# Patient Record
Sex: Female | Born: 1970 | Race: White | Hispanic: No | Marital: Married | Smoking: Never smoker
Health system: Southern US, Community
[De-identification: ages and names within clinical notes are randomized; demographics above are authoritative.]

## PROBLEM LIST (undated history)

## (undated) DIAGNOSIS — Z8719 Personal history of other diseases of the digestive system: Secondary | ICD-10-CM

## (undated) DIAGNOSIS — F419 Anxiety disorder, unspecified: Secondary | ICD-10-CM

## (undated) DIAGNOSIS — E785 Hyperlipidemia, unspecified: Secondary | ICD-10-CM

## (undated) DIAGNOSIS — M1990 Unspecified osteoarthritis, unspecified site: Secondary | ICD-10-CM

## (undated) DIAGNOSIS — F32A Depression, unspecified: Secondary | ICD-10-CM

## (undated) DIAGNOSIS — R112 Nausea with vomiting, unspecified: Secondary | ICD-10-CM

## (undated) DIAGNOSIS — J189 Pneumonia, unspecified organism: Secondary | ICD-10-CM

## (undated) DIAGNOSIS — E669 Obesity, unspecified: Secondary | ICD-10-CM

## (undated) DIAGNOSIS — F329 Major depressive disorder, single episode, unspecified: Secondary | ICD-10-CM

## (undated) DIAGNOSIS — Z9889 Other specified postprocedural states: Secondary | ICD-10-CM

## (undated) DIAGNOSIS — T8859XA Other complications of anesthesia, initial encounter: Secondary | ICD-10-CM

## (undated) HISTORY — DX: Hyperlipidemia, unspecified: E78.5

## (undated) HISTORY — PX: LAPAROSCOPIC GASTRIC BANDING: SHX1100

## (undated) HISTORY — DX: Obesity, unspecified: E66.9

## (undated) HISTORY — PX: TONSILLECTOMY: SHX5217

## (undated) HISTORY — PX: CHOLECYSTECTOMY: SHX55

## (undated) HISTORY — DX: Depression, unspecified: F32.A

## (undated) HISTORY — PX: BREAST REDUCTION SURGERY: SHX8

## (undated) HISTORY — PX: ROTATOR CUFF REPAIR: SHX139

---
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
## 2000-02-24 ENCOUNTER — Ambulatory Visit (HOSPITAL_BASED_OUTPATIENT_CLINIC_OR_DEPARTMENT_OTHER): Admission: RE | Admit: 2000-02-24 | Discharge: 2000-02-25 | Payer: Self-pay | Admitting: Otolaryngology

## 2000-02-24 ENCOUNTER — Encounter (INDEPENDENT_AMBULATORY_CARE_PROVIDER_SITE_OTHER): Payer: Self-pay | Admitting: Specialist

## 2007-10-08 ENCOUNTER — Encounter: Admission: RE | Admit: 2007-10-08 | Discharge: 2007-10-08 | Payer: Self-pay | Admitting: *Deleted

## 2007-10-08 ENCOUNTER — Ambulatory Visit (HOSPITAL_COMMUNITY): Admission: RE | Admit: 2007-10-08 | Discharge: 2007-10-08 | Payer: Self-pay | Admitting: *Deleted

## 2007-10-10 ENCOUNTER — Ambulatory Visit (HOSPITAL_COMMUNITY): Admission: RE | Admit: 2007-10-10 | Discharge: 2007-10-10 | Payer: Self-pay | Admitting: *Deleted

## 2008-02-05 ENCOUNTER — Encounter: Admission: RE | Admit: 2008-02-05 | Discharge: 2008-03-09 | Payer: Self-pay | Admitting: *Deleted

## 2008-02-25 ENCOUNTER — Ambulatory Visit (HOSPITAL_COMMUNITY): Admission: RE | Admit: 2008-02-25 | Discharge: 2008-02-26 | Payer: Self-pay | Admitting: *Deleted

## 2008-02-26 ENCOUNTER — Ambulatory Visit: Payer: Self-pay | Admitting: Vascular Surgery

## 2008-02-26 ENCOUNTER — Encounter (INDEPENDENT_AMBULATORY_CARE_PROVIDER_SITE_OTHER): Payer: Self-pay | Admitting: *Deleted

## 2008-03-12 ENCOUNTER — Encounter: Admission: RE | Admit: 2008-03-12 | Discharge: 2008-06-10 | Payer: Self-pay | Admitting: *Deleted

## 2010-08-23 NOTE — Op Note (Signed)
NAME:  Heidi Olson, Heidi Olson     ACCOUNT NO.:  [id number]   MEDICAL RECORD NO.:  [id number]          PATIENT TYPE:  OIB   LOCATION:  1525                         FACILITY:  [facility]   PHYSICIAN:  Alfonse Ras, MD   DATE OF BIRTH:  [birth year]   DATE OF PROCEDURE:  02/25/2008  DATE OF DISCHARGE:                               OPERATIVE REPORT   PREOPERATIVE DIAGNOSIS:  Medically refractory morbid obesity, BMI of 46.   POSTOPERATIVE DIAGNOSIS:  Medically refractory morbid obesity, BMI of  46.   PROCEDURE:  Laparoscopic adjustable gastric banding with the APS system.   SURGEON:  Alfonse Ras, M.D.   ASSISTANT:  Sandria Bales. Ezzard Standing, M.D.   ANESTHESIA:  General.   DESCRIPTION:  The patient was taken to the operating room after informed  consent was granted.  After adequate general anesthesia was induced  using endotracheal tube, the abdomen was prepped and draped in normal  sterile fashion after time-out was accomplished with the OR staff using  a 11 mm OptiVu trocar in the left upper quadrant under direct vision,  pneumoperitoneum was obtained.  A 15-mm trocar was placed in the right  upper quadrant, a 11-mm trocar was placed just below that right upper  quadrant, a 11-mm trocar was placed in left paramedian position.  The  patient was placed in the steep head-up position.  The infant liver  retractor was placed and the left lateral segment of the liver was  retracted anteriorly.  Angle of Hiss was sharply and bluntly dissected.  The pars flaccida was then opened with sharp dissection.  The crossing  fat on the right crus of the diaphragm was identified and incised.  The  band passer was then placed in a retrogastric position and brought out  at the angle of Hiss.  An APS band was then placed around the stomach.  A sizing balloon was placed down through the esophagus and then inflated  to 15 mL of air and pulled back.  There was significant resistance and  no evidence of hiatal  hernia.  Balloon was deflated and the band was  snapped around the sizing tube.  It moved easily.  The sizing tube was  removed.  Anterior plication was accomplished with interrupted 2-0  Ethibond sutures.  An Anti-Slip stitch was also placed.  The band was in  good position and the tubing was brought out through the lower of the  right upper quadrant incision.  I tried to place a subcutaneous port.  However, it did not lay nicely and so I opted to fix it in a traditional  fashion with 2-0 Prolene down to the anterior abdominal fascia.  The  subcutaneous tissue was closed with running 2-0 Vicryl.  Skin incisions  were closed with subcuticular 4-0 Monocryl.  Steri-Strips and sterile  dressings were applied.  The patient tolerated the procedure well, went  to PACU in good condition.      Alfonse Ras, MD  Electronically Signed     KRE/MEDQ  D:  02/25/2008  T:  02/26/2008  Job:  [id number]

## 2011-01-10 LAB — HEMOGLOBIN AND HEMATOCRIT, BLOOD
HCT: 41
Hemoglobin: 13.9

## 2011-01-10 LAB — DIFFERENTIAL
Basophils Absolute: 0
Basophils Relative: 0
Eosinophils Absolute: 0
Eosinophils Relative: 0
Lymphocytes Relative: 27
Lymphocytes Relative: 6 — ABNORMAL LOW
Lymphs Abs: 0.6 — ABNORMAL LOW
Lymphs Abs: 1.5
Monocytes Absolute: 0.5
Monocytes Relative: 5
Monocytes Relative: 8
Neutro Abs: 7.6
Neutrophils Relative %: 88 — ABNORMAL HIGH

## 2011-01-10 LAB — CBC
HCT: 40.3
MCHC: 33.9
MCV: 93
Platelets: 272
RBC: 4.33
RDW: 12.8
WBC: 8.7

## 2011-01-10 LAB — COMPREHENSIVE METABOLIC PANEL
Alkaline Phosphatase: 61
Calcium: 9.4
GFR calc Af Amer: >60
GFR calc non Af Amer: >60
Sodium: 141
Total Bilirubin: 0.8

## 2011-01-10 LAB — PREGNANCY, URINE: Preg Test, Ur: NEGATIVE

## 2011-02-08 ENCOUNTER — Encounter (INDEPENDENT_AMBULATORY_CARE_PROVIDER_SITE_OTHER): Payer: Self-pay | Admitting: General Surgery

## 2011-04-26 ENCOUNTER — Encounter (INDEPENDENT_AMBULATORY_CARE_PROVIDER_SITE_OTHER): Payer: Self-pay | Admitting: General Surgery

## 2011-04-26 ENCOUNTER — Ambulatory Visit (INDEPENDENT_AMBULATORY_CARE_PROVIDER_SITE_OTHER): Payer: BC Managed Care – PPO | Admitting: General Surgery

## 2011-04-26 DIAGNOSIS — Z9884 Bariatric surgery status: Secondary | ICD-10-CM | POA: Insufficient documentation

## 2011-04-26 NOTE — Progress Notes (Signed)
History: Patient returns for followup of LAP-BAND placed November of 2009. She was last seen here almost 2 years ago. She has done extremely well with her weight loss having lost a total of 135 pounds from preop of 291 to current 156. PMI is down from preop of 46.2 to current 24.8. She feels very appropriate restriction with small amounts of food but is not having any significant problems with vomiting or regurgitation or nighttime reflux. She has had a breast reduction and is anticipating a tummy tuck by Dr. Stephens November. She has noted some tenderness and prominence at her port it was concerned there could be a problem that would interfere with her abdominoplasty. It has not been read or swollen.  Exam:  General: Healthy-appearing female Skin: Warm and dry no rash infection Abdomen: Soft and nontender. I do not feel any hernia, fluid collection, or other abnormality around the port.  Assessment and plan: Doing extremely well following lap band was remarkable weight loss and no complications identified. I did not see a problem with the port. She had no preoperative comorbidities. I told her if Dr. Stephens November needed to move the port to a different location at the time of her abdominoplasty this would not be a problem. It of course needs to remain in place. She will return in one year.

## 2013-07-29 ENCOUNTER — Telehealth (HOSPITAL_COMMUNITY): Payer: Self-pay

## 2013-07-29 NOTE — Telephone Encounter (Signed)
This patient is overdue for recommended follow-up with a bariatric surgeon at [facility]. Call attempted today to reestablish post-op care with [facility], but unable to reach patient by phone.  A letter will be mailed to the patient today to the address on file from [place] advising the patient on the benefits of follow-up care and directing them to call [facility] at [phone] to schedule an appointment at their earliest convenience.  ° °Amanda T. Fleming °Bariatric Office Coordinator °[phone] ° °

## 2013-10-27 ENCOUNTER — Ambulatory Visit (INDEPENDENT_AMBULATORY_CARE_PROVIDER_SITE_OTHER): Payer: BC Managed Care – PPO | Admitting: General Surgery

## 2013-10-27 ENCOUNTER — Encounter (INDEPENDENT_AMBULATORY_CARE_PROVIDER_SITE_OTHER): Payer: Self-pay | Admitting: General Surgery

## 2013-10-27 VITALS — BP 110/64 | HR 72 | Temp 98.6°F | Resp 14 | Ht 67.0 in | Wt 167.2 lb

## 2013-10-27 DIAGNOSIS — Z4651 Encounter for fitting and adjustment of gastric lap band: Secondary | ICD-10-CM

## 2013-10-28 ENCOUNTER — Ambulatory Visit
Admission: RE | Admit: 2013-10-28 | Discharge: 2013-10-28 | Disposition: A | Discharge status: Home / Self Care | Payer: BC Managed Care – PPO | Source: Ambulatory Visit | Attending: General Surgery | Admitting: General Surgery

## 2013-10-28 ENCOUNTER — Telehealth (INDEPENDENT_AMBULATORY_CARE_PROVIDER_SITE_OTHER): Payer: Self-pay

## 2013-10-28 DIAGNOSIS — Z4651 Encounter for fitting and adjustment of gastric lap band: Secondary | ICD-10-CM

## 2013-10-28 NOTE — Progress Notes (Signed)
Chief complaint: Progressive by mouth intolerance, status post lap band  History: Patient returns to the office with a history of lap band placement November 2009 by Dr. Zettie Pho.  She was last seen January 2013 with excellent weight loss and no adjustment made. She was anticipating abdominoplasty. She has not returned since then. She did not have the abdominoplasty done. She states that she began having dysphagia about a month ago. This has worsened progressively. She has had a feeling of a lump or pain in her epigastrium and progressive intolerance to initially solids and for about the last week even liquids. She's been having a lot of reflux.  She has had wheezing and cough which has not responded to medical management.  Exam: BP 110/64  Pulse 72  Temp(Src) 98.6 F (37 C) (Oral)  Resp 14  Ht 5\' 7"  (1.702 m)  Wt 167 lb 3.2 oz (75.841 kg)  BMI 26.18 kg/m2 Total weight loss 124 pounds, upper lip and pounds from January 2013 General: Well-developed Caucasian female no acute distress Abdomen: Soft and nontender. Port site looks fine.  Assessment and plan: I suspect all of her symptoms including respiratory issues are secondary to over restriction of her lap band with reflux. With the degree of difficulty and the pain she is having on concerned about a possible slipped. I recommended we remove all the fluid from her band as an initial step. I accessed her port without difficulty and found 5.5 cc and removed all of this. She felt immediately that she was able to swallow water without difficulty which is a marked improvement. We will schedule her for barium swallow in the next day or 2 and I will call her with the results. We give her a default appointment to return in 4 weeks.

## 2013-10-29 ENCOUNTER — Telehealth (INDEPENDENT_AMBULATORY_CARE_PROVIDER_SITE_OTHER): Payer: Self-pay | Admitting: General Surgery

## 2013-10-29 NOTE — Telephone Encounter (Signed)
Call the patient with report of barium swallow showing no slip

## 2013-11-19 ENCOUNTER — Encounter (INDEPENDENT_AMBULATORY_CARE_PROVIDER_SITE_OTHER): Payer: Self-pay | Admitting: General Surgery

## 2013-11-19 ENCOUNTER — Ambulatory Visit (INDEPENDENT_AMBULATORY_CARE_PROVIDER_SITE_OTHER): Payer: BC Managed Care – PPO | Admitting: General Surgery

## 2013-11-19 VITALS — BP 122/76 | HR 75 | Ht 66.0 in | Wt 176.0 lb

## 2013-11-19 DIAGNOSIS — Z4651 Encounter for fitting and adjustment of gastric lap band: Secondary | ICD-10-CM

## 2013-11-19 NOTE — Progress Notes (Signed)
Chief complaint: Followup LAP-BAND  History: Patient returned to the office after all fluid was removed from her LAP-BAND for restriction 3 weeks ago. Her band was placed a number of years ago with excellent weight loss. Her symptoms were completely relieved after removal of the fluid.  Barium swallow was obtained showing no slip or complication. She is now eating greater amounts. She feels she should and has put on some weight and is interested in refilling.  Exam: BP 122/76  Pulse 75  Ht 5\' 6"  (1.676 m)  Wt 176 lb (79.833 kg)  BMI 28.42 kg/m2 General: Appears well Abdomen: Soft nontender nondistended  Assessment and plan: Episode of over restriction. No complication identified. We elect to go ahead with a fill today and I added 3 cc without difficulty. Return in approximately one month.

## 2013-12-31 ENCOUNTER — Encounter (INDEPENDENT_AMBULATORY_CARE_PROVIDER_SITE_OTHER): Payer: BC Managed Care – PPO | Admitting: General Surgery

## 2016-09-28 ENCOUNTER — Encounter (HOSPITAL_COMMUNITY): Payer: Self-pay

## 2019-03-12 ENCOUNTER — Encounter: Payer: Self-pay | Admitting: Gastroenterology

## 2019-04-14 ENCOUNTER — Other Ambulatory Visit: Payer: Self-pay

## 2019-04-14 ENCOUNTER — Ambulatory Visit: Payer: Self-pay | Admitting: *Deleted

## 2019-04-14 VITALS — Temp 96.6°F | Ht 67.25 in | Wt 195.0 lb

## 2019-04-14 DIAGNOSIS — Z1159 Encounter for screening for other viral diseases: Secondary | ICD-10-CM

## 2019-04-14 DIAGNOSIS — Z1211 Encounter for screening for malignant neoplasm of colon: Secondary | ICD-10-CM

## 2019-04-14 MED ORDER — SUPREP BOWEL PREP KIT 17.5-3.13-1.6 GM/177ML PO SOLN: 1.0000 | Freq: Once | ORAL | 0 refills | Status: AC

## 2019-04-14 NOTE — Progress Notes (Signed)
No egg or soy allergy known to patient  No issues with past sedation with any surgeries  or procedures, no intubation problems - with general anesthesia some N/V No diet pills per patient No home 02 use per patient  No blood thinners per patient  Pt denies issues with constipation  No A fib or A flutter  EMMI video sent to pt's e mail   suprep $15   Due to the COVID-19 pandemic we are asking patients to follow these guidelines. Please only bring one care partner. Please be aware that your care partner may wait in the car in the parking lot or if they feel like they will be too hot to wait in the car, they may wait in the lobby on the 4th floor. All care partners are required to wear a mask the entire time (we do not have any that we can provide them), they need to practice social distancing, and we will do a Covid check for all patient's and care partners when you arrive. Also we will check their temperature and your temperature. If the care partner waits in their car they need to stay in the parking lot the entire time and we will call them on their cell phone when the patient is ready for discharge so they can bring the car to the front of the building. Also all patient's will need to wear a mask into building.

## 2019-04-15 ENCOUNTER — Encounter: Payer: Self-pay | Admitting: Gastroenterology

## 2019-04-23 ENCOUNTER — Ambulatory Visit (INDEPENDENT_AMBULATORY_CARE_PROVIDER_SITE_OTHER): Payer: BC Managed Care – PPO

## 2019-04-23 ENCOUNTER — Telehealth: Payer: Self-pay | Admitting: Gastroenterology

## 2019-04-23 ENCOUNTER — Other Ambulatory Visit: Payer: Self-pay | Admitting: Gastroenterology

## 2019-04-23 DIAGNOSIS — Z1159 Encounter for screening for other viral diseases: Secondary | ICD-10-CM

## 2019-04-23 DIAGNOSIS — Z1211 Encounter for screening for malignant neoplasm of colon: Secondary | ICD-10-CM

## 2019-04-23 MED ORDER — CLENPIQ 10-3.5-12 MG-GM -GM/160ML PO SOLN: 1.0000 | ORAL | 0 refills | Status: DC

## 2019-04-23 NOTE — Telephone Encounter (Signed)
New RX sent to patient's pharmacy for Clenpiq. New Clenpiq prep instructions sent to pt via Email. Pt is aware.

## 2019-04-24 LAB — SARS CORONAVIRUS 2 (TAT 6-24 HRS): SARS Coronavirus 2: NEGATIVE

## 2019-04-28 ENCOUNTER — Ambulatory Visit (AMBULATORY_SURGERY_CENTER): Payer: BC Managed Care – PPO | Admitting: Gastroenterology

## 2019-04-28 ENCOUNTER — Encounter: Payer: Self-pay | Admitting: Gastroenterology

## 2019-04-28 ENCOUNTER — Other Ambulatory Visit: Payer: Self-pay

## 2019-04-28 VITALS — BP 107/73 | HR 70 | Temp 98.0°F | Resp 10 | Ht 67.25 in | Wt 195.0 lb

## 2019-04-28 DIAGNOSIS — Z1211 Encounter for screening for malignant neoplasm of colon: Secondary | ICD-10-CM

## 2019-04-28 DIAGNOSIS — D127 Benign neoplasm of rectosigmoid junction: Secondary | ICD-10-CM

## 2019-04-28 MED ORDER — SODIUM CHLORIDE 0.9 % IV SOLN: 500.0000 mL | Freq: Once | INTRAVENOUS | Status: DC

## 2019-04-28 NOTE — Progress Notes (Signed)
Called to room to assist during endoscopic procedure.  Patient ID and intended procedure confirmed with present staff. Received instructions for my participation in the procedure from the performing physician.  

## 2019-04-28 NOTE — Progress Notes (Addendum)
Per Dr. Lyndel Safe he went over the findings with pt's fiance. Maw  No problems noted in the recovery room. maw

## 2019-04-28 NOTE — Progress Notes (Signed)
Report to PACU, RN, vss, BBS= Clear.  

## 2019-04-28 NOTE — Op Note (Signed)
[place] Patient Name: Heidi Olson [facility] Procedure Date: 04/28/2019 9:35 AM MRN: [id number] Endoscopist: Jackquline Denmark , MD Age: 49 Referring MD:  Date of Birth: 1970/05/18 Gender: Female Account #: [id number] Procedure:                Colonoscopy Indications:              Screening for colorectal malignant neoplasm Medicines:                Monitored Anesthesia Care Procedure:                Pre-Anesthesia Assessment:                           - Prior to the procedure, a History and Physical                            was performed, and patient medications and                            allergies were reviewed. The patient's tolerance of                            previous anesthesia was also reviewed. The risks                            and benefits of the procedure and the sedation                            options and risks were discussed with the patient.                            All questions were answered, and informed consent                            was obtained. Prior Anticoagulants: The patient has                            taken no previous anticoagulant or antiplatelet                            agents. ASA Grade Assessment: II - A patient with                            mild systemic disease. After reviewing the risks                            and benefits, the patient was deemed in                            satisfactory condition to undergo the procedure.                           After obtaining informed consent, the colonoscope  was passed under direct vision. Throughout the                            procedure, the patient's blood pressure, pulse, and                            oxygen saturations were monitored continuously. The                            Colonoscope was introduced through the anus and                            advanced to the 1 cm into the ileum. The                            colonoscopy was  performed without difficulty. The                            patient tolerated the procedure well. The quality                            of the bowel preparation was good. The terminal                            ileum, ileocecal valve, appendiceal orifice, and                            rectum were photographed. Scope In: 9:40:19 AM Scope Out: 9:58:06 AM Scope Withdrawal Time: 0 hours 13 minutes 33 seconds  Total Procedure Duration: 0 hours 17 minutes 47 seconds  Findings:                 A 4 mm polyp was found in the recto-sigmoid colon.                            The polyp was sessile. The polyp was removed with a                            cold snare. Resection and retrieval were complete.                           A single small-mouthed diverticulum was found in                            the sigmoid colon.                           Non-bleeding internal hemorrhoids were found during                            retroflexion. The hemorrhoids were small.                           The terminal ileum appeared normal.  The exam was otherwise without abnormality on                            direct and retroflexion views. Complications:            No immediate complications. Estimated Blood Loss:     Estimated blood loss: none. Impression:               - One 4 mm polyp at the recto-sigmoid colon,                            removed with a cold snare. Resected and retrieved.                           - Minimal sigmoid diverticulosis.                           - Otherwise normal colonoscopy to TI. Recommendation:           - Patient has a contact number available for                            emergencies. The signs and symptoms of potential                            delayed complications were discussed with the                            patient. Return to normal activities tomorrow.                            Written discharge instructions were provided to the                             patient.                           - Resume previous diet.                           - Continue present medications.                           - Await pathology results.                           - Repeat colonoscopy for surveillance based on                            pathology results.                           - Return to GI clinic PRN.                           - D/W Lennette Bihari. Jackquline Denmark, MD 04/28/2019 10:03:03 AM This report has been signed electronically.

## 2019-04-28 NOTE — Patient Instructions (Signed)
YOU HAD AN ENDOSCOPIC PROCEDURE TODAY AT [facility]:   Refer to the procedure report that was given to you for any specific questions about what was found during the examination.  If the procedure report does not answer your questions, please call your gastroenterologist to clarify.  If you requested that your care partner not be given the details of your procedure findings, then the procedure report has been included in a sealed envelope for you to review at your convenience later.  YOU SHOULD EXPECT: Some feelings of bloating in the abdomen. Passage of more gas than usual.  Walking can help get rid of the air that was put into your GI tract during the procedure and reduce the bloating. If you had a lower endoscopy (such as a colonoscopy or flexible sigmoidoscopy) you may notice spotting of blood in your stool or on the toilet paper. If you underwent a bowel prep for your procedure, you may not have a normal bowel movement for a few days.  Please Note:  You might notice some irritation and congestion in your nose or some drainage.  This is from the oxygen used during your procedure.  There is no need for concern and it should clear up in a day or so.  SYMPTOMS TO REPORT IMMEDIATELY:   Following lower endoscopy (colonoscopy or flexible sigmoidoscopy):  Excessive amounts of blood in the stool  Significant tenderness or worsening of abdominal pains  Swelling of the abdomen that is new, acute  Fever of 100F or higher   For urgent or emergent issues, a gastroenterologist can be reached at any hour by calling [phone].   DIET:  We do recommend a small meal at first, but then you may proceed to your regular diet.  Drink plenty of fluids but you should avoid alcoholic beverages for 24 hours.  ACTIVITY:  You should plan to take it easy for the rest of today and you should NOT DRIVE or use heavy machinery until tomorrow (because of the sedation medicines used during the test).     FOLLOW UP: Our staff will call the number listed on your records 48-72 hours following your procedure to check on you and address any questions or concerns that you may have regarding the information given to you following your procedure. If we do not reach you, we will leave a message.  We will attempt to reach you two times.  During this call, we will ask if you have developed any symptoms of COVID 19. If you develop any symptoms (ie: fever, flu-like symptoms, shortness of breath, cough etc.) before then, please call [phone].  If you test positive for Covid 19 in the 2 weeks post procedure, please call and report this information to [place].    If any biopsies were taken you will be contacted by phone or by letter within the next 1-3 weeks.  Please call us at [phone] if you have not heard about the biopsies in 3 weeks.    SIGNATURES/CONFIDENTIALITY: You and/or your care partner have signed paperwork which will be entered into your electronic medical record.  These signatures attest to the fact that that the information above on your After Visit Summary has been reviewed and is understood.  Full responsibility of the confidentiality of this discharge information lies with you and/or your care-partner.    Handouts were given to your  on polyps, diverticulosis, hemorrhoids,and a high fiber diet with liberal fluid intake. You may resume your current medications  today. Await biopsy results. Please call if any questions or concerns.

## 2019-04-28 NOTE — Progress Notes (Signed)
Pt. Reports no change in her medical or surgical history since her pre-visit 04/14/2019.

## 2019-04-30 ENCOUNTER — Telehealth: Payer: Self-pay

## 2019-04-30 NOTE — Telephone Encounter (Signed)
  Follow up Call-  Call back number 04/28/2019  Post procedure Call Back phone  # [phone]  Permission to leave phone message Yes  Some recent data might be hidden     Patient questions:  Do you have a fever, pain , or abdominal swelling? No. Pain Score  0 *  Have you tolerated food without any problems? Yes.    Have you been able to return to your normal activities? Yes.    Do you have any questions about your discharge instructions: Diet   No. Medications  No. Follow up visit  No.  Do you have questions or concerns about your Care? No.  Actions: * If pain score is 4 or above: No action needed, pain <4.  1. Have you developed a fever since your procedure? No  2.   Have you had an respiratory symptoms (SOB or cough) since your procedure? No  3.   Have you tested positive for COVID 19 since your procedure No  4.   Have you had any family members/close contacts diagnosed with the COVID 19 since your procedure?  No   If yes to any of these questions please route to Joylene John, RN and Alphonsa Gin, RN.

## 2019-05-01 ENCOUNTER — Encounter: Payer: Self-pay | Admitting: Gastroenterology

## 2020-03-03 ENCOUNTER — Other Ambulatory Visit (HOSPITAL_COMMUNITY): Payer: Self-pay | Admitting: Surgery

## 2020-03-03 ENCOUNTER — Other Ambulatory Visit: Payer: Self-pay | Admitting: Surgery

## 2020-03-03 DIAGNOSIS — Z9884 Bariatric surgery status: Secondary | ICD-10-CM

## 2020-03-18 ENCOUNTER — Encounter: Payer: BC Managed Care – PPO | Attending: Surgery | Admitting: Dietician

## 2020-03-18 ENCOUNTER — Ambulatory Visit (HOSPITAL_COMMUNITY)
Admission: RE | Admit: 2020-03-18 | Discharge: 2020-03-18 | Disposition: A | Discharge status: Home / Self Care | Payer: BC Managed Care – PPO | Source: Ambulatory Visit | Attending: Surgery | Admitting: Surgery

## 2020-03-18 ENCOUNTER — Other Ambulatory Visit: Payer: Self-pay

## 2020-03-18 ENCOUNTER — Encounter: Payer: Self-pay | Admitting: Dietician

## 2020-03-18 DIAGNOSIS — E669 Obesity, unspecified: Secondary | ICD-10-CM | POA: Diagnosis present

## 2020-03-18 DIAGNOSIS — Z9884 Bariatric surgery status: Secondary | ICD-10-CM | POA: Diagnosis present

## 2020-03-18 NOTE — Progress Notes (Signed)
Nutrition Assessment for Bariatric Surgery Medical Nutrition Therapy   Patient was seen on 03/18/2020 for Pre-Operative Nutrition Assessment. Letter of approval faxed to [facility] bariatric surgery program coordinator on 03/18/2020.   Referral stated Supervised Weight Loss (SWL) visits needed: 0  Planned surgery: LapBand to Sleeve conversion Pt expectation of surgery: to be healthier, to improve cholesterol levels, to live longer, be more physically active, avoid future health problems   NUTRITION ASSESSMENT   Anthropometrics  Start weight at [facility]: 240.5 lbs (date: 03/18/2020) Height: 67 in BMI: 37.7 kg/m2     Lifestyle & Dietary Hx Patient had LapBand placed 12 years ago, states she lost a lot of weight and got down to 150 lbs. States that recently the band has been causing her pain and her weight has been increasing. Typical meal pattern is 3 meals plus snacks throughout the day. Will drink fluids with meals and sips on water between meals. Avoids rice, bread, fries, hamburger, and some other meats. Former vegetarian. Cooks a lot at home, may eat out twice per week. Mostly avoids fried foods. Takes a women's daily MVI. 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   Any previous deficiencies: iron, vitamin D  Micronutrient Nutrition Focused Physical Exam: Hair: no issues observed Eyes: no issues observed Mouth: no issues observed Neck: no issues observed Nails: no issues observed Skin: no issues observed  24-Hr Dietary Recall First Meal: 2 strips low sodium bacon  Snack: milk chocolate  Second Meal: salad greens + protein + shredded cheese + croutons + [place] dressing Snack: Cheetos  Third Meal: chicken + baked potato + side salad + asparagus  Snack: - Beverages: coffee w/ sweetened creamer, caffeine free diet soda, water   NUTRITION DIAGNOSIS  Overweight/obesity ([place]-3.3) related to past poor dietary habits and physical  inactivity as evidenced by patient w/ planned LapBand to Sleeve conversion surgery following dietary guidelines for continued weight loss.    NUTRITION INTERVENTION  Nutrition counseling (C-1) and education (E-2) to facilitate bariatric surgery goals.  Pre-Op Goals Reviewed with the Patient . Track food and beverage intake (pen and paper, MyFitness Pal, Baritastic app, etc.) . Make healthy food choices while monitoring portion sizes . Consume 3 meals per day or try to eat every 3-5 hours . Avoid concentrated sugars and fried foods . Keep sugar & fat in the single digits per serving on food labels . Practice CHEWING your food (aim for applesauce consistency) . Practice not drinking 15 minutes before, during, and 30 minutes after each meal and snack . Avoid all carbonated beverages (ex: soda, sparkling beverages)  . Limit caffeinated beverages (ex: coffee, tea, energy drinks) . Avoid all sugar-sweetened beverages (ex: regular soda, sports drinks)  . Avoid alcohol  . Aim for 64-100 ounces of FLUID daily (with at least half of fluid intake being plain water)  . Aim for at least 60-80 grams of PROTEIN daily . Look for a liquid protein source that contains ?15 g protein and ?5 g carbohydrate (ex: shakes, drinks, shots) . Make a list of non-food related activities . Physical activity is an important part of a healthy lifestyle so keep it moving! The goal is to reach 150 minutes of exercise per week, including cardiovascular and weight baring activity.  *Goals that are bolded indicate the pt would like to start working towards these  Handouts Provided Include  . Bariatric Surgery Nutrition Visits . Pre-Op Goals  Learning Style & Readiness for Change Teaching  method utilized: Optician, dispensing  Demonstrated degree of understanding via: Teach Back  Barriers to learning/adherence to lifestyle change: None Identified    MONITORING & EVALUATION Dietary intake, weekly physical activity, body  weight, and pre-op goals reached at next nutrition visit.   Next Steps Patient is to follow up at [facility] for Pre-Op Class (>2 weeks before surgery) for further nutrition education. From a nutritional standpoint, patient appears to be an appropriate candidate for bariatric surgery.

## 2020-03-31 ENCOUNTER — Ambulatory Visit: Payer: BC Managed Care – PPO | Admitting: Dietician

## 2020-05-07 ENCOUNTER — Ambulatory Visit: Payer: Self-pay | Admitting: Surgery

## 2020-05-10 ENCOUNTER — Other Ambulatory Visit: Payer: Self-pay

## 2020-05-10 ENCOUNTER — Encounter: Payer: BC Managed Care – PPO | Attending: Surgery | Admitting: Skilled Nursing Facility1

## 2020-05-10 DIAGNOSIS — E669 Obesity, unspecified: Secondary | ICD-10-CM | POA: Insufficient documentation

## 2020-05-11 NOTE — Progress Notes (Signed)
Pre-Operative Nutrition Class:  Appt start time: 7793   End time:  1830.  Patient was seen on 05/10/2020 for Pre-Operative Bariatric Surgery Education at [facility]    Surgery date: 06/08/2020 Surgery type: Gastric band to sleeve Start weight at [facility]: 240.5 Weight today: 243.6  The following the learning objectives were met by the patient during this course:  Identify Pre-Op Dietary Goals and will begin 2 weeks pre-operatively  Identify appropriate sources of fluids and proteins   State protein recommendations and appropriate sources pre and post-operatively  Identify Post-Operative Dietary Goals and will follow for 2 weeks post-operatively  Identify appropriate multivitamin and calcium sources  Describe the need for physical activity post-operatively and will follow MD recommendations  State when to call healthcare provider regarding medication questions or post-operative complications  Handouts given during class include:  Pre-Op Bariatric Surgery Diet Handout  Protein Shake Handout  Post-Op Bariatric Surgery Nutrition Handout  [facility] Program Information Flyer  Support Group Information Flyer  [facility] Bariatric Supplements Price List  Follow-Up Plan: Patient will follow-up at [facility] 2 weeks post operatively for diet advancement per MD.

## 2020-05-12 ENCOUNTER — Other Ambulatory Visit: Payer: Self-pay

## 2020-05-12 ENCOUNTER — Encounter (HOSPITAL_COMMUNITY): Payer: Self-pay | Admitting: Surgery

## 2020-05-13 ENCOUNTER — Other Ambulatory Visit (HOSPITAL_COMMUNITY): Payer: Self-pay

## 2020-05-15 ENCOUNTER — Other Ambulatory Visit (HOSPITAL_COMMUNITY)
Admission: RE | Admit: 2020-05-15 | Discharge: 2020-05-15 | Disposition: A | Discharge status: Home / Self Care | Payer: BC Managed Care – PPO | Source: Ambulatory Visit | Attending: Surgery | Admitting: Surgery

## 2020-05-15 DIAGNOSIS — Z20822 Contact with and (suspected) exposure to covid-19: Secondary | ICD-10-CM | POA: Diagnosis not present

## 2020-05-15 DIAGNOSIS — Z01812 Encounter for preprocedural laboratory examination: Secondary | ICD-10-CM | POA: Diagnosis not present

## 2020-05-15 LAB — SARS CORONAVIRUS 2 (TAT 6-24 HRS): SARS Coronavirus 2: NEGATIVE

## 2020-05-18 ENCOUNTER — Encounter (HOSPITAL_COMMUNITY)
Admission: RE | Disposition: A | Discharge status: Home / Self Care | Payer: Self-pay | Source: Home / Self Care | Attending: Surgery

## 2020-05-18 ENCOUNTER — Other Ambulatory Visit: Payer: Self-pay

## 2020-05-18 ENCOUNTER — Encounter (HOSPITAL_COMMUNITY): Payer: Self-pay | Admitting: Surgery

## 2020-05-18 ENCOUNTER — Ambulatory Visit (HOSPITAL_COMMUNITY): Payer: BC Managed Care – PPO | Admitting: Certified Registered"

## 2020-05-18 ENCOUNTER — Ambulatory Visit (HOSPITAL_COMMUNITY)
Admission: RE | Admit: 2020-05-18 | Discharge: 2020-05-18 | Disposition: A | Discharge status: Home / Self Care | Payer: BC Managed Care – PPO | Attending: Surgery | Admitting: Surgery

## 2020-05-18 DIAGNOSIS — R1310 Dysphagia, unspecified: Secondary | ICD-10-CM | POA: Insufficient documentation

## 2020-05-18 DIAGNOSIS — Z9884 Bariatric surgery status: Secondary | ICD-10-CM | POA: Diagnosis not present

## 2020-05-18 DIAGNOSIS — Z79899 Other long term (current) drug therapy: Secondary | ICD-10-CM | POA: Insufficient documentation

## 2020-05-18 DIAGNOSIS — K2289 Other specified disease of esophagus: Secondary | ICD-10-CM | POA: Diagnosis not present

## 2020-05-18 HISTORY — PX: ESOPHAGOGASTRODUODENOSCOPY: SHX5428

## 2020-05-18 HISTORY — PX: BIOPSY: SHX5522

## 2020-05-18 SURGERY — EGD (ESOPHAGOGASTRODUODENOSCOPY)
Anesthesia: Monitor Anesthesia Care

## 2020-05-18 MED ORDER — PROPOFOL 10 MG/ML IV BOLUS
INTRAVENOUS | Status: DC | PRN
  Administered 2020-05-18 (×6): 20 mg via INTRAVENOUS

## 2020-05-18 MED ORDER — PROPOFOL 500 MG/50ML IV EMUL
INTRAVENOUS | Status: DC | PRN
  Administered 2020-05-18: 150 ug/kg/min via INTRAVENOUS

## 2020-05-18 MED ORDER — PROPOFOL 1000 MG/100ML IV EMUL
INTRAVENOUS | Status: AC
  Filled 2020-05-18: qty 100

## 2020-05-18 MED ORDER — LACTATED RINGERS IV SOLN: INTRAVENOUS | Status: DC | PRN

## 2020-05-18 MED ORDER — PROPOFOL 10 MG/ML IV BOLUS
INTRAVENOUS | Status: AC
  Filled 2020-05-18: qty 20

## 2020-05-18 MED ORDER — LIDOCAINE 2% (20 MG/ML) 5 ML SYRINGE
INTRAMUSCULAR | Status: DC | PRN
  Administered 2020-05-18: 40 mg via INTRAVENOUS

## 2020-05-18 NOTE — Anesthesia Postprocedure Evaluation (Signed)
Anesthesia Post Note  Patient: [name]  Procedure(s) Performed: ESOPHAGOGASTRODUODENOSCOPY (EGD) WITH BIOSPY (N/A ) BIOPSY     Patient location during evaluation: PACU Anesthesia Type: MAC Level of consciousness: awake and alert Pain management: pain level controlled Vital Signs Assessment: post-procedure vital signs reviewed and stable Respiratory status: spontaneous breathing, nonlabored ventilation, respiratory function stable and patient connected to nasal cannula oxygen Cardiovascular status: stable and blood pressure returned to baseline Postop Assessment: no apparent nausea or vomiting Anesthetic complications: no   No complications documented.  Last Vitals:  Vitals:   05/18/20 1220 05/18/20 1230  BP: 128/77 114/71  Pulse: 71 73  Resp: 16 11  Temp:    SpO2: 100% 100%    Last Pain:  Vitals:   05/18/20 1220  TempSrc:   PainSc: 0-No pain                 Jesus Nevills

## 2020-05-18 NOTE — Transfer of Care (Signed)
Immediate Anesthesia Transfer of Care Note  Patient: Heidi Olson  Procedure(s) Performed: ESOPHAGOGASTRODUODENOSCOPY (EGD) WITH BIOSPY (N/A ) BIOPSY  Patient Location: PACU and Endoscopy Unit  Anesthesia Type:MAC  Level of Consciousness: awake, alert  and patient cooperative  Airway & Oxygen Therapy: spontaneous breathing with face mask O2  Post-op Assessment: Report given to RN and Post -op Vital signs reviewed and stable  Post vital signs: Reviewed and stable  Last Vitals:  Vitals Value Taken Time  BP 141/84 05/18/20 1202  Temp    Pulse 80 05/18/20 1205  Resp 23 05/18/20 1205  SpO2 99 % 05/18/20 1205  Vitals shown include unvalidated device data.  Last Pain:  Vitals:   05/18/20 1202  TempSrc:   PainSc: 0-No pain         Complications: No complications documented.

## 2020-05-18 NOTE — Op Note (Signed)
[facility] Patient Name: Heidi Olson Procedure Date: 05/18/2020 MRN: [id number] Attending MD: Felicie Morn ,  Date of Birth: [birth year] CSN: [id number] Age: 50 Admit Type: Inpatient Procedure:                Upper GI endoscopy Indications:              Dysphagia Providers:                Felicie Morn, Jobe Igo, RN, Benetta Spar, Technician Referring MD:              Medicines:                Monitored Anesthesia Care Complications:            No immediate complications. Estimated Blood Loss:     Estimated blood loss was minimal. Procedure:                Pre-Anesthesia Assessment:                           - Monitored anesthesia care under the supervision                            of a CRNA was determined to be medically necessary                            for this procedure based on review of the patient's                            medical history, medications, and prior anesthesia                            history.                           - The anesthesia plan was to use monitored                            anesthesia care (MAC).                           - The anesthesia plan was to use monitored                            anesthesia care (MAC).                           After obtaining informed consent, the endoscope was                            passed under direct vision. Throughout the                            procedure, the patient's blood pressure, pulse, and  oxygen saturations were monitored continuously. The                            GIF-H190 ([id number]) Olympus gastroscope was                            introduced through the mouth, and advanced to the                            second part of duodenum. The upper GI endoscopy was                            accomplished without difficulty. The patient                            tolerated the procedure well. Scope  In: Scope Out: Findings:      The lumen of the distal esophagus was moderately dilated.      Evidence of an adjustable gastric banding was found in the gastric       fundus.      The gastric antrum was normal. Biopsies were taken with a cold forceps       for Helicobacter pylori testing. Verification of patient identification       for the specimen was done. Estimated blood loss was minimal.      The in the duodenum was normal. Impression:               - Dilation in the distal esophagus.                           - An adjustable gastric banding was found.                           - Normal antrum. Biopsied.                           - Normal. Moderate Sedation:      Not Applicable - Patient had care per Anesthesia. Recommendation:           - Discharge patient to home.                           - Resume previous diet.                           - Continue present medications.                           - Await pathology results. Procedure Code(s):        --- Professional ---                           [id number], Esophagogastroduodenoscopy, flexible,                            transoral; with biopsy, single or multiple Diagnosis Code(s):        --- Professional ---  R13.10, Dysphagia, unspecified                           Z98.84, Bariatric surgery status                           K22.8, Other specified diseases of esophagus CPT copyright 2019 American Medical Association. All rights reserved. The codes documented in this report are preliminary and upon coder review may  be revised to meet current compliance requirements. [place],  05/18/2020 12:05:01 PM Number of Addenda: 0

## 2020-05-18 NOTE — Anesthesia Preprocedure Evaluation (Addendum)
Anesthesia Evaluation  Patient identified by MRN, date of birth, ID band Patient awake    Reviewed: Allergy & Precautions, H&P , NPO status , Patient's Chart, lab work & pertinent test results, reviewed documented beta blocker date and time   Airway Mallampati: I  TM Distance: >3 FB Neck ROM: full    Dental no notable dental hx. (+) Teeth Intact, Dental Advisory Given   Pulmonary neg pulmonary ROS,    Pulmonary exam normal breath sounds clear to auscultation       Cardiovascular Exercise Tolerance: Good negative cardio ROS   Rhythm:regular Rate:Normal     Neuro/Psych PSYCHIATRIC DISORDERS Anxiety Depression negative neurological ROS     GI/Hepatic negative GI ROS, Neg liver ROS,   Endo/Other  Morbid obesity  Renal/GU negative Renal ROS  negative genitourinary   Musculoskeletal   Abdominal   Peds  Hematology negative hematology ROS (+)   Anesthesia Other Findings   Reproductive/Obstetrics negative OB ROS                            Anesthesia Physical Anesthesia Plan  ASA: II  Anesthesia Plan: MAC   Post-op Pain Management:    Induction: Intravenous  PONV Risk Score and Plan: 2  Airway Management Planned: Mask and Natural Airway  Additional Equipment:   Intra-op Plan:   Post-operative Plan:   Informed Consent: I have reviewed the patients History and Physical, chart, labs and discussed the procedure including the risks, benefits and alternatives for the proposed anesthesia with the patient or authorized representative who has indicated his/her understanding and acceptance.     Dental Advisory Given  Plan Discussed with: CRNA and Anesthesiologist  Anesthesia Plan Comments:        Anesthesia Quick Evaluation

## 2020-05-18 NOTE — Anesthesia Procedure Notes (Signed)
Procedure Name: MAC Date/Time: 05/18/2020 11:40 AM Performed by: Eben Burow, CRNA Pre-anesthesia Checklist: Patient identified, Emergency Drugs available, Suction available, Patient being monitored and Timeout performed Oxygen Delivery Method: Simple face mask Placement Confirmation: positive ETCO2

## 2020-05-18 NOTE — H&P (Signed)
Admitting Physician: Nickola Major Amarri Michaelson  Service: General Surgery  CC: Elective EGD  Subjective   HPI: Heidi Olson is an 50 y.o. female who is here for elective EGD prior to bariatric surgery.  She had a previous laparoscopic adjustable band placement and is being evaluated for converstion to a laparoscopic gastric sleeve.   Past Medical History:  Diagnosis Date  . Anxiety   . Depression   . Hyperlipidemia   . Obesity     Past Surgical History:  Procedure Laterality Date  . BREAST REDUCTION SURGERY    . CHOLECYSTECTOMY    . LAPAROSCOPIC GASTRIC BANDING    . TONSILLECTOMY      Family History  Problem Relation Age of Onset  . Cancer Maternal Grandmother        lung  . Heart disease Maternal Grandmother   . Cancer Maternal Grandfather        esophageal  . Esophageal cancer Maternal Grandfather   . Colon cancer Neg Hx   . Colon polyps Neg Hx   . Rectal cancer Neg Hx   . Stomach cancer Neg Hx     Social:  reports that she has never smoked. She has never used smokeless tobacco. She reports that she does not drink alcohol and does not use drugs.  Allergies:  Allergies  Allergen Reactions  . Demerol [Meperidine] Nausea And Vomiting    Vomiting, severe abd pain   . Latex Hives    Medications: Current Outpatient Medications  Medication Instructions  . ALPRAZolam (XANAX) 0.25 mg, Oral, Daily PRN  . calcium carbonate (OS-CAL) 600 mg, Oral, 2 times daily with meals  . FLUoxetine (PROZAC) 40 mg, Oral, Daily  . Multiple Vitamin (MULTIVITAMIN) capsule 2 capsules, Oral, Daily, Woman's vitamin  . Norlyda 0.35 mg, Oral, Daily    ROS - all of the below systems have been reviewed with the patient and positives are indicated with bold text General: chills, fever or night sweats Eyes: blurry vision or double vision ENT: epistaxis or sore throat Allergy/Immunology: itchy/watery eyes or nasal congestion Hematologic/Lymphatic: bleeding problems, blood clots or swollen  lymph nodes Endocrine: temperature intolerance or unexpected weight changes Breast: new or changing breast lumps or nipple discharge Resp: cough, shortness of breath, or wheezing CV: chest pain or dyspnea on exertion GI: as per HPI GU: dysuria, trouble voiding, or hematuria MSK: joint pain or joint stiffness Neuro: TIA or stroke symptoms Derm: pruritus and skin lesion changes Psych: anxiety and depression  Objective   PE Height 5\' 8"  (1.727 m), weight 110.2 kg. Constitutional: NAD; conversant; no deformities Eyes: Moist conjunctiva; no lid lag; anicteric; PERRL Neck: Trachea midline; no thyromegaly Lungs: Normal respiratory effort; no tactile fremitus CV: RRR; no palpable thrills; no pitting edema GI: Abd Soft, non-tender; no palpable hepatosplenomegaly MSK: Normal range of motion of extremities; no clubbing/cyanosis Psychiatric: Appropriate affect; alert and oriented x3 Lymphatic: No palpable cervical or axillary lymphadenopathy  No results found for this or any previous visit (from the past 24 hour(s)).  Imaging Orders  No imaging studies ordered today     Assessment and Plan   Heidi Olson is an 50 y.o. female who is here for elective EGD prior to bariatric surgery.  She had a previous laparoscopic adjustable band placement and is being evaluated for converstion to a laparoscopic gastric sleeve.  The risks, benefits and alternatives of EGD with biopsy were discussed with the patient who granted consent to proceed.  We will proceed as scheduled.  Nickola Major  Kimori Tartaglia, [facility] Use [email or web address] to contact on call provider

## 2020-05-20 LAB — SURGICAL PATHOLOGY

## 2020-05-21 ENCOUNTER — Encounter (HOSPITAL_COMMUNITY): Payer: Self-pay | Admitting: Surgery

## 2020-06-02 NOTE — Patient Instructions (Signed)
DUE TO COVID-19 ONLY ONE VISITOR IS ALLOWED TO COME WITH YOU AND STAY IN THE WAITING ROOM ONLY DURING PRE OP AND PROCEDURE DAY OF SURGERY. THE 1 VISITOR  MAY VISIT WITH YOU AFTER SURGERY IN YOUR PRIVATE ROOM DURING VISITING HOURS ONLY!  YOU NEED TO HAVE A COVID 19 TEST ON__2-25-22_____ @_______ , THIS TEST MUST BE DONE BEFORE SURGERY,  COVID TESTING SITE [address] [place] [address], IT IS ON THE RIGHT GOING OUT [address] APPROXIMATELY  2 MINUTES PAST [employer] ON THE RIGHT. ONCE YOUR COVID TEST IS COMPLETED,  PLEASE BEGIN THE QUARANTINE INSTRUCTIONS AS OUTLINED IN YOUR HANDOUT.                Heidi Olson  06/02/2020   Your procedure is scheduled on: 06-08-20   Report to [facility] Main  Entrance   Report to admitting at       0830 AM     Call this number if you have problems the morning of surgery [phone]    Remember: Do not eat food  :After Midnight.     CLEAR LIQUID DIET  Until 0730 am then nothing by mouth   Foods Allowed                                                                     Black Coffee and tea, regular and decaf                              Plain Jell-O any favor except red or purple                                            Fruit ices (not with fruit pulp)                                     Iced Popsicles                                                                 Cranberry, grape and apple juices Sports drinks like Gatorade Lightly seasoned clear broth or consume(fat free) Sugar, honey syrup  _____________________________________________________________________   BRUSH YOUR TEETH MORNING OF SURGERY AND RINSE YOUR MOUTH OUT, NO CHEWING GUM CANDY OR MINTS.     Take these medicines the morning of surgery with A SIP OF WATER: prozac, xanax if needed                               You may not have any metal on your body including hair pins and              piercings  Do not wear jewelry, make-up, lotions,  powders or perfumes, deodorant  Do not wear nail polish on your fingernails.  Do not shave  48 hours prior to surgery.     Do not bring valuables to the hospital. [place].  Contacts, dentures or bridgework may not be worn into surgery.      Patients discharged the day of surgery will not be allowed to drive home. IF YOU ARE HAVING SURGERY AND GOING HOME THE SAME DAY, YOU MUST HAVE AN ADULT TO DRIVE YOU HOME AND BE WITH YOU FOR 24 HOURS. YOU MAY GO HOME BY TAXI OR UBER OR ORTHERWISE, BUT AN ADULT MUST ACCOMPANY YOU HOME AND STAY WITH YOU FOR 24 HOURS.  Name and phone number of your driver:  Special Instructions: N/A              Please read over the following fact sheets you were given: _____________________________________________________________________             [facility] - Preparing for Surgery Before surgery, you can play an important role.  Because skin is not sterile, your skin needs to be as free of germs as possible.  You can reduce the number of germs on your skin by washing with CHG (chlorahexidine gluconate) soap before surgery.  CHG is an antiseptic cleaner which kills germs and bonds with the skin to continue killing germs even after washing. Please DO NOT use if you have an allergy to CHG or antibacterial soaps.  If your skin becomes reddened/irritated stop using the CHG and inform your nurse when you arrive at Short Stay. Do not shave (including legs and underarms) for at least 48 hours prior to the first CHG shower.  You may shave your face/neck. Please follow these instructions carefully:  1.  Shower with CHG Soap the night before surgery and the  morning of Surgery.  2.  If you choose to wash your hair, wash your hair first as usual with your  normal  shampoo.  3.  After you shampoo, rinse your hair and body thoroughly to remove the  shampoo.                           4.  Use CHG as you would any other  liquid soap.  You can apply chg directly  to the skin and wash                       Gently with a scrungie or clean washcloth.  5.  Apply the CHG Soap to your body ONLY FROM THE NECK DOWN.   Do not use on face/ open                           Wound or open sores. Avoid contact with eyes, ears mouth and genitals (private parts).                       Wash face,  Genitals (private parts) with your normal soap.             6.  Wash thoroughly, paying special attention to the area where your surgery  will be performed.  7.  Thoroughly rinse your body with warm water from the neck down.  8.  DO NOT shower/wash with your normal soap after using and rinsing  off  the CHG Soap.                9.  Pat yourself dry with a clean towel.            10.  Wear clean pajamas.            11.  Place clean sheets on your bed the night of your first shower and do not  sleep with pets. Day of Surgery : Do not apply any lotions/deodorants the morning of surgery.  Please wear clean clothes to the hospital/surgery center.  FAILURE TO FOLLOW THESE INSTRUCTIONS MAY RESULT IN THE CANCELLATION OF YOUR SURGERY PATIENT SIGNATURE_________________________________  NURSE SIGNATURE__________________________________  ________________________________________________________________________

## 2020-06-02 NOTE — Progress Notes (Addendum)
Please place orders in epic for Lap Sleeve surgery pt. Has preop tomorrow

## 2020-06-03 ENCOUNTER — Other Ambulatory Visit: Payer: Self-pay

## 2020-06-03 ENCOUNTER — Ambulatory Visit: Payer: Self-pay | Admitting: Surgery

## 2020-06-03 ENCOUNTER — Encounter (HOSPITAL_COMMUNITY)
Admission: RE | Admit: 2020-06-03 | Discharge: 2020-06-03 | Disposition: A | Discharge status: Home / Self Care | Payer: BC Managed Care – PPO | Source: Ambulatory Visit | Attending: Surgery | Admitting: Surgery

## 2020-06-03 ENCOUNTER — Encounter (HOSPITAL_COMMUNITY): Payer: Self-pay

## 2020-06-03 DIAGNOSIS — Z01812 Encounter for preprocedural laboratory examination: Secondary | ICD-10-CM | POA: Insufficient documentation

## 2020-06-03 HISTORY — DX: Other specified postprocedural states: Z98.890

## 2020-06-03 HISTORY — DX: Nausea with vomiting, unspecified: R11.2

## 2020-06-03 HISTORY — DX: Other complications of anesthesia, initial encounter: T88.59XA

## 2020-06-03 HISTORY — DX: Personal history of other diseases of the digestive system: Z87.19

## 2020-06-03 HISTORY — DX: Pneumonia, unspecified organism: J18.9

## 2020-06-03 HISTORY — DX: Unspecified osteoarthritis, unspecified site: M19.90

## 2020-06-03 LAB — CBC
HCT: 41.6 % (ref 36.0–46.0)
Hemoglobin: 13.5 g/dL (ref 12.0–15.0)
MCH: 30.8 pg (ref 26.0–34.0)
MCHC: 32.5 g/dL (ref 30.0–36.0)
MCV: 95 fL (ref 80.0–100.0)
Platelets: 307 10*3/uL (ref 150–400)
RBC: 4.38 MIL/uL (ref 3.87–5.11)
RDW: 12.6 % (ref 11.5–15.5)
WBC: 5.3 10*3/uL (ref 4.0–10.5)
nRBC: 0 % (ref 0.0–0.2)

## 2020-06-03 LAB — BASIC METABOLIC PANEL
Anion gap: 9 (ref 5–15)
BUN: 14 mg/dL (ref 6–20)
CO2: 27 mmol/L (ref 22–32)
Calcium: 9.4 mg/dL (ref 8.9–10.3)
Chloride: 105 mmol/L (ref 98–111)
Creatinine, Ser: 0.83 mg/dL (ref 0.44–1.00)
GFR, Estimated: >60 mL/min (ref >60)
Glucose, Bld: 108 mg/dL — ABNORMAL HIGH (ref 70–99)
Potassium: 4.1 mmol/L (ref 3.5–5.1)
Sodium: 141 mmol/L (ref 135–145)

## 2020-06-04 ENCOUNTER — Inpatient Hospital Stay (HOSPITAL_COMMUNITY): Admission: RE | Admit: 2020-06-04 | Payer: Self-pay | Source: Ambulatory Visit

## 2020-06-05 ENCOUNTER — Other Ambulatory Visit (HOSPITAL_COMMUNITY)
Admission: RE | Admit: 2020-06-05 | Discharge: 2020-06-05 | Disposition: A | Discharge status: Home / Self Care | Payer: BC Managed Care – PPO | Source: Ambulatory Visit | Attending: Surgery | Admitting: Surgery

## 2020-06-05 DIAGNOSIS — Z01812 Encounter for preprocedural laboratory examination: Secondary | ICD-10-CM | POA: Insufficient documentation

## 2020-06-05 DIAGNOSIS — Z20822 Contact with and (suspected) exposure to covid-19: Secondary | ICD-10-CM | POA: Insufficient documentation

## 2020-06-05 LAB — SARS CORONAVIRUS 2 (TAT 6-24 HRS): SARS Coronavirus 2: NEGATIVE

## 2020-06-07 MED ORDER — BUPIVACAINE LIPOSOME 1.3 % IJ SUSP: 20.0000 mL | Freq: Once | INTRAMUSCULAR | Status: DC

## 2020-06-08 ENCOUNTER — Inpatient Hospital Stay (HOSPITAL_COMMUNITY): Payer: BC Managed Care – PPO | Admitting: Certified Registered Nurse Anesthetist

## 2020-06-08 ENCOUNTER — Other Ambulatory Visit: Payer: Self-pay

## 2020-06-08 ENCOUNTER — Inpatient Hospital Stay (HOSPITAL_COMMUNITY)
Admission: RE | Admit: 2020-06-08 | Discharge: 2020-06-09 | DRG: 989 | Disposition: A | Discharge status: Home / Self Care | Payer: BC Managed Care – PPO | Source: Ambulatory Visit | Attending: Surgery | Admitting: Surgery

## 2020-06-08 ENCOUNTER — Encounter (HOSPITAL_COMMUNITY)
Admission: RE | Disposition: A | Discharge status: Home / Self Care | Payer: Self-pay | Source: Ambulatory Visit | Attending: Surgery

## 2020-06-08 ENCOUNTER — Encounter (HOSPITAL_COMMUNITY): Payer: Self-pay | Admitting: Surgery

## 2020-06-08 DIAGNOSIS — Z20822 Contact with and (suspected) exposure to covid-19: Secondary | ICD-10-CM | POA: Diagnosis present

## 2020-06-08 DIAGNOSIS — Z8249 Family history of ischemic heart disease and other diseases of the circulatory system: Secondary | ICD-10-CM

## 2020-06-08 DIAGNOSIS — M1990 Unspecified osteoarthritis, unspecified site: Secondary | ICD-10-CM | POA: Diagnosis present

## 2020-06-08 DIAGNOSIS — Z6835 Body mass index (BMI) 35.0-35.9, adult: Secondary | ICD-10-CM | POA: Diagnosis not present

## 2020-06-08 DIAGNOSIS — E785 Hyperlipidemia, unspecified: Secondary | ICD-10-CM | POA: Diagnosis present

## 2020-06-08 DIAGNOSIS — K449 Diaphragmatic hernia without obstruction or gangrene: Secondary | ICD-10-CM | POA: Diagnosis present

## 2020-06-08 DIAGNOSIS — Y733 Surgical instruments, materials and gastroenterology and urology devices (including sutures) associated with adverse incidents: Secondary | ICD-10-CM | POA: Diagnosis present

## 2020-06-08 DIAGNOSIS — T85518A Breakdown (mechanical) of other gastrointestinal prosthetic devices, implants and grafts, initial encounter: Secondary | ICD-10-CM | POA: Diagnosis present

## 2020-06-08 DIAGNOSIS — Z91040 Latex allergy status: Secondary | ICD-10-CM

## 2020-06-08 DIAGNOSIS — Z885 Allergy status to narcotic agent status: Secondary | ICD-10-CM | POA: Diagnosis not present

## 2020-06-08 DIAGNOSIS — Z800 Family history of malignant neoplasm of digestive organs: Secondary | ICD-10-CM

## 2020-06-08 HISTORY — PX: UPPER GI ENDOSCOPY: SHX6162

## 2020-06-08 HISTORY — PX: LAPAROSCOPIC GASTRIC SLEEVE RESECTION: SHX5895

## 2020-06-08 LAB — COMPREHENSIVE METABOLIC PANEL
ALT: 15 U/L (ref 0–44)
AST: 30 U/L (ref 15–41)
Albumin: 3.8 g/dL (ref 3.5–5.0)
Alkaline Phosphatase: 60 U/L (ref 38–126)
Anion gap: 8 (ref 5–15)
BUN: 13 mg/dL (ref 6–20)
CO2: 27 mmol/L (ref 22–32)
Calcium: 9.3 mg/dL (ref 8.9–10.3)
Chloride: 105 mmol/L (ref 98–111)
Creatinine, Ser: 0.78 mg/dL (ref 0.44–1.00)
GFR, Estimated: >60 mL/min (ref >60)
Glucose, Bld: 92 mg/dL (ref 70–99)
Potassium: 4.3 mmol/L (ref 3.5–5.1)
Sodium: 140 mmol/L (ref 135–145)
Total Bilirubin: 1 mg/dL (ref 0.3–1.2)
Total Protein: 6.9 g/dL (ref 6.5–8.1)

## 2020-06-08 LAB — TYPE AND SCREEN
ABO/RH(D): A POS
Antibody Screen: NEGATIVE

## 2020-06-08 LAB — CBC WITH DIFFERENTIAL/PLATELET
Abs Immature Granulocytes: 0 10*3/uL (ref 0.00–0.07)
Basophils Absolute: 0.1 10*3/uL (ref 0.0–0.1)
Basophils Relative: 1 %
Eosinophils Absolute: 0.1 10*3/uL (ref 0.0–0.5)
Eosinophils Relative: 3 %
HCT: 40.6 % (ref 36.0–46.0)
Hemoglobin: 13.6 g/dL (ref 12.0–15.0)
Immature Granulocytes: 0 %
Lymphocytes Relative: 25 %
Lymphs Abs: 1.2 10*3/uL (ref 0.7–4.0)
MCH: 31.1 pg (ref 26.0–34.0)
MCHC: 33.5 g/dL (ref 30.0–36.0)
MCV: 92.9 fL (ref 80.0–100.0)
Monocytes Absolute: 0.4 10*3/uL (ref 0.1–1.0)
Monocytes Relative: 9 %
Neutro Abs: 2.8 10*3/uL (ref 1.7–7.7)
Neutrophils Relative %: 62 %
Platelets: 298 10*3/uL (ref 150–400)
RBC: 4.37 MIL/uL (ref 3.87–5.11)
RDW: 12.6 % (ref 11.5–15.5)
WBC: 4.6 10*3/uL (ref 4.0–10.5)
nRBC: 0 % (ref 0.0–0.2)

## 2020-06-08 LAB — HEMOGLOBIN AND HEMATOCRIT, BLOOD
HCT: 41.7 % (ref 36.0–46.0)
Hemoglobin: 13.4 g/dL (ref 12.0–15.0)

## 2020-06-08 LAB — PREGNANCY, URINE: Preg Test, Ur: NEGATIVE

## 2020-06-08 LAB — ABO/RH: ABO/RH(D): A POS

## 2020-06-08 SURGERY — GASTRECTOMY, SLEEVE, LAPAROSCOPIC
Anesthesia: General | Site: Esophagus

## 2020-06-08 MED ORDER — CHLORHEXIDINE GLUCONATE 0.12 % MT SOLN
15.0000 mL | Freq: Once | OROMUCOSAL | Status: AC
  Administered 2020-06-08: 15 mL via OROMUCOSAL

## 2020-06-08 MED ORDER — SUGAMMADEX SODIUM 200 MG/2ML IV SOLN
INTRAVENOUS | Status: DC | PRN
  Administered 2020-06-08: 300 mg via INTRAVENOUS

## 2020-06-08 MED ORDER — PHENYLEPHRINE HCL (PRESSORS) 10 MG/ML IV SOLN
INTRAVENOUS | Status: AC
  Filled 2020-06-08: qty 1

## 2020-06-08 MED ORDER — APREPITANT 40 MG PO CAPS
40.0000 mg | ORAL_CAPSULE | ORAL | Status: AC
  Administered 2020-06-08: 40 mg via ORAL
  Filled 2020-06-08: qty 1

## 2020-06-08 MED ORDER — LACTATED RINGERS IV SOLN: INTRAVENOUS | Status: DC

## 2020-06-08 MED ORDER — PROMETHAZINE HCL 25 MG/ML IJ SOLN
INTRAMUSCULAR | Status: AC
  Filled 2020-06-08: qty 1

## 2020-06-08 MED ORDER — OXYCODONE HCL 5 MG/5ML PO SOLN: 5.0000 mg | Freq: Once | ORAL | Status: DC | PRN

## 2020-06-08 MED ORDER — LIDOCAINE HCL (PF) 2 % IJ SOLN
INTRAMUSCULAR | Status: DC | PRN
  Administered 2020-06-08: 1.5 mg/kg/h via INTRADERMAL

## 2020-06-08 MED ORDER — BUPIVACAINE LIPOSOME 1.3 % IJ SUSP
20.0000 mL | Freq: Once | INTRAMUSCULAR | Status: AC
  Administered 2020-06-08: 20 mL
  Filled 2020-06-08: qty 20

## 2020-06-08 MED ORDER — PROMETHAZINE HCL 25 MG/ML IJ SOLN
6.2500 mg | INTRAMUSCULAR | Status: DC | PRN
  Administered 2020-06-08: 6.25 mg via INTRAVENOUS

## 2020-06-08 MED ORDER — CHLORHEXIDINE GLUCONATE CLOTH 2 % EX PADS: 6.0000 | MEDICATED_PAD | Freq: Once | CUTANEOUS | Status: DC

## 2020-06-08 MED ORDER — OXYCODONE HCL 5 MG/5ML PO SOLN
5.0000 mg | Freq: Four times a day (QID) | ORAL | Status: DC | PRN
Start: 2020-06-08 — End: 2020-06-09
  Administered 2020-06-08 – 2020-06-09 (×3): 5 mg via ORAL
  Filled 2020-06-08 (×3): qty 5

## 2020-06-08 MED ORDER — ACETAMINOPHEN 500 MG PO TABS
1000.0000 mg | ORAL_TABLET | ORAL | Status: AC
  Administered 2020-06-08: 1000 mg via ORAL
  Filled 2020-06-08: qty 2

## 2020-06-08 MED ORDER — KETOROLAC TROMETHAMINE 30 MG/ML IJ SOLN
INTRAMUSCULAR | Status: AC
  Filled 2020-06-08: qty 1

## 2020-06-08 MED ORDER — SODIUM CHLORIDE 0.9 % IV SOLN
2.0000 g | INTRAVENOUS | Status: AC
  Administered 2020-06-08: 2 g via INTRAVENOUS
  Filled 2020-06-08: qty 2

## 2020-06-08 MED ORDER — ALPRAZOLAM 0.25 MG PO TABS: 0.1250 mg | ORAL_TABLET | Freq: Every evening | ORAL | Status: DC | PRN

## 2020-06-08 MED ORDER — PHENYLEPHRINE 40 MCG/ML (10ML) SYRINGE FOR IV PUSH (FOR BLOOD PRESSURE SUPPORT)
PREFILLED_SYRINGE | INTRAVENOUS | Status: DC | PRN
  Administered 2020-06-08 (×3): 120 ug via INTRAVENOUS

## 2020-06-08 MED ORDER — BUPIVACAINE-EPINEPHRINE 0.25% -1:200000 IJ SOLN
INTRAMUSCULAR | Status: DC | PRN
  Administered 2020-06-08: 30 mL

## 2020-06-08 MED ORDER — SUCCINYLCHOLINE CHLORIDE 200 MG/10ML IV SOSY
PREFILLED_SYRINGE | INTRAVENOUS | Status: DC | PRN
  Administered 2020-06-08: 160 mg via INTRAVENOUS

## 2020-06-08 MED ORDER — FENTANYL CITRATE (PF) 250 MCG/5ML IJ SOLN
INTRAMUSCULAR | Status: AC
  Filled 2020-06-08: qty 5

## 2020-06-08 MED ORDER — GABAPENTIN 300 MG PO CAPS
300.0000 mg | ORAL_CAPSULE | ORAL | Status: AC
  Administered 2020-06-08: 300 mg via ORAL
  Filled 2020-06-08: qty 1

## 2020-06-08 MED ORDER — HYDROMORPHONE HCL 1 MG/ML IJ SOLN
0.2500 mg | INTRAMUSCULAR | Status: DC | PRN
  Administered 2020-06-08: 0.5 mg via INTRAVENOUS

## 2020-06-08 MED ORDER — OXYCODONE HCL 5 MG PO TABS
5.0000 mg | ORAL_TABLET | Freq: Once | ORAL | Status: DC | PRN
Start: 2020-06-08 — End: 2020-06-08

## 2020-06-08 MED ORDER — ACETAMINOPHEN 160 MG/5ML PO SOLN
1000.0000 mg | Freq: Three times a day (TID) | ORAL | Status: DC
  Administered 2020-06-09 (×2): 1000 mg via ORAL
  Filled 2020-06-08 (×2): qty 40.6

## 2020-06-08 MED ORDER — FENTANYL CITRATE (PF) 100 MCG/2ML IJ SOLN
INTRAMUSCULAR | Status: DC | PRN
  Administered 2020-06-08: 50 ug via INTRAVENOUS
  Administered 2020-06-08 (×2): 100 ug via INTRAVENOUS

## 2020-06-08 MED ORDER — ONDANSETRON HCL 4 MG/2ML IJ SOLN
INTRAMUSCULAR | Status: AC
  Filled 2020-06-08: qty 2

## 2020-06-08 MED ORDER — ENSURE MAX PROTEIN PO LIQD
2.0000 [oz_av] | ORAL | Status: DC
  Administered 2020-06-09 (×4): 2 [oz_av] via ORAL

## 2020-06-08 MED ORDER — ROCURONIUM BROMIDE 10 MG/ML (PF) SYRINGE
PREFILLED_SYRINGE | INTRAVENOUS | Status: AC
  Filled 2020-06-08: qty 10

## 2020-06-08 MED ORDER — PROPOFOL 10 MG/ML IV BOLUS
INTRAVENOUS | Status: AC
  Filled 2020-06-08: qty 20

## 2020-06-08 MED ORDER — FLUOXETINE HCL 20 MG PO CAPS
40.0000 mg | ORAL_CAPSULE | Freq: Every day | ORAL | Status: DC
Start: 2020-06-09 — End: 2020-06-09
  Administered 2020-06-09: 10:00:00 40 mg via ORAL
  Filled 2020-06-08 (×2): qty 2

## 2020-06-08 MED ORDER — SUCCINYLCHOLINE CHLORIDE 200 MG/10ML IV SOSY
PREFILLED_SYRINGE | INTRAVENOUS | Status: AC
  Filled 2020-06-08: qty 10

## 2020-06-08 MED ORDER — CELECOXIB 200 MG PO CAPS
400.0000 mg | ORAL_CAPSULE | ORAL | Status: AC
  Administered 2020-06-08: 400 mg via ORAL
  Filled 2020-06-08: qty 2

## 2020-06-08 MED ORDER — PHENYLEPHRINE 40 MCG/ML (10ML) SYRINGE FOR IV PUSH (FOR BLOOD PRESSURE SUPPORT)
PREFILLED_SYRINGE | INTRAVENOUS | Status: AC
  Filled 2020-06-08: qty 10

## 2020-06-08 MED ORDER — ONDANSETRON HCL 4 MG/2ML IJ SOLN
INTRAMUSCULAR | Status: DC | PRN
  Administered 2020-06-08: 4 mg via INTRAVENOUS

## 2020-06-08 MED ORDER — KETAMINE HCL 10 MG/ML IJ SOLN
INTRAMUSCULAR | Status: DC | PRN
  Administered 2020-06-08: 30 mg via INTRAVENOUS

## 2020-06-08 MED ORDER — BUPIVACAINE-EPINEPHRINE (PF) 0.25% -1:200000 IJ SOLN
INTRAMUSCULAR | Status: AC
  Filled 2020-06-08: qty 30

## 2020-06-08 MED ORDER — MIDAZOLAM HCL 5 MG/5ML IJ SOLN
INTRAMUSCULAR | Status: DC | PRN
  Administered 2020-06-08: 2 mg via INTRAVENOUS

## 2020-06-08 MED ORDER — DEXAMETHASONE SODIUM PHOSPHATE 10 MG/ML IJ SOLN
INTRAMUSCULAR | Status: AC
  Filled 2020-06-08: qty 1

## 2020-06-08 MED ORDER — HEPARIN SODIUM (PORCINE) 5000 UNIT/ML IJ SOLN
5000.0000 [IU] | Freq: Three times a day (TID) | INTRAMUSCULAR | Status: DC
  Administered 2020-06-08 – 2020-06-09 (×4): 5000 [IU] via SUBCUTANEOUS
  Filled 2020-06-08 (×4): qty 1

## 2020-06-08 MED ORDER — ROCURONIUM BROMIDE 10 MG/ML (PF) SYRINGE
PREFILLED_SYRINGE | INTRAVENOUS | Status: DC | PRN
  Administered 2020-06-08: 80 mg via INTRAVENOUS

## 2020-06-08 MED ORDER — LIDOCAINE HCL 2 % IJ SOLN
INTRAMUSCULAR | Status: AC
  Filled 2020-06-08: qty 20

## 2020-06-08 MED ORDER — MIDAZOLAM HCL 2 MG/2ML IJ SOLN
INTRAMUSCULAR | Status: AC
  Filled 2020-06-08: qty 2

## 2020-06-08 MED ORDER — LIDOCAINE 2% (20 MG/ML) 5 ML SYRINGE
INTRAMUSCULAR | Status: DC | PRN
  Administered 2020-06-08: 100 mg via INTRAVENOUS

## 2020-06-08 MED ORDER — DEXAMETHASONE SODIUM PHOSPHATE 4 MG/ML IJ SOLN
INTRAMUSCULAR | Status: DC | PRN
  Administered 2020-06-08: 10 mg via INTRAVENOUS

## 2020-06-08 MED ORDER — HYDROMORPHONE HCL 1 MG/ML IJ SOLN
INTRAMUSCULAR | Status: AC
  Filled 2020-06-08: qty 1

## 2020-06-08 MED ORDER — PHENYLEPHRINE HCL-NACL 10-0.9 MG/250ML-% IV SOLN
INTRAVENOUS | Status: DC | PRN
  Administered 2020-06-08: 40 ug/min via INTRAVENOUS

## 2020-06-08 MED ORDER — ONDANSETRON HCL 4 MG/2ML IJ SOLN: 4.0000 mg | INTRAMUSCULAR | Status: DC | PRN

## 2020-06-08 MED ORDER — PROCHLORPERAZINE EDISYLATE 10 MG/2ML IJ SOLN: 5.0000 mg | INTRAMUSCULAR | Status: DC | PRN

## 2020-06-08 MED ORDER — KETOROLAC TROMETHAMINE 30 MG/ML IJ SOLN
30.0000 mg | Freq: Once | INTRAMUSCULAR | Status: AC | PRN
  Administered 2020-06-08: 30 mg via INTRAVENOUS

## 2020-06-08 MED ORDER — PROPOFOL 10 MG/ML IV BOLUS
INTRAVENOUS | Status: DC | PRN
  Administered 2020-06-08: 200 mg via INTRAVENOUS

## 2020-06-08 MED ORDER — LACTATED RINGERS IV SOLN
INTRAVENOUS | Status: AC | PRN
  Administered 2020-06-08: 1000 mL

## 2020-06-08 MED ORDER — HYDROMORPHONE HCL 1 MG/ML IJ SOLN
0.5000 mg | INTRAMUSCULAR | Status: DC | PRN
Start: 2020-06-08 — End: 2020-06-09

## 2020-06-08 MED ORDER — SCOPOLAMINE 1 MG/3DAYS TD PT72
1.0000 | MEDICATED_PATCH | TRANSDERMAL | Status: DC
  Administered 2020-06-08: 1.5 mg via TRANSDERMAL
  Filled 2020-06-08: qty 1

## 2020-06-08 MED ORDER — ORAL CARE MOUTH RINSE: 15.0000 mL | Freq: Once | OROMUCOSAL | Status: AC

## 2020-06-08 MED ORDER — ACETAMINOPHEN 500 MG PO TABS
1000.0000 mg | ORAL_TABLET | Freq: Three times a day (TID) | ORAL | Status: DC
  Administered 2020-06-08 – 2020-06-09 (×2): 1000 mg via ORAL
  Filled 2020-06-08 (×2): qty 2

## 2020-06-08 MED ORDER — HEPARIN SODIUM (PORCINE) 5000 UNIT/ML IJ SOLN
5000.0000 [IU] | INTRAMUSCULAR | Status: AC
  Administered 2020-06-08: 5000 [IU] via SUBCUTANEOUS
  Filled 2020-06-08: qty 1

## 2020-06-08 MED ORDER — 0.9 % SODIUM CHLORIDE (POUR BTL) OPTIME
TOPICAL | Status: DC | PRN
  Administered 2020-06-08: 1000 mL

## 2020-06-08 MED ORDER — PANTOPRAZOLE SODIUM 40 MG IV SOLR
40.0000 mg | Freq: Every day | INTRAVENOUS | Status: DC
  Administered 2020-06-08: 21:00:00 40 mg via INTRAVENOUS
  Filled 2020-06-08: qty 40

## 2020-06-08 MED ORDER — SUGAMMADEX SODIUM 500 MG/5ML IV SOLN
INTRAVENOUS | Status: AC
  Filled 2020-06-08: qty 5

## 2020-06-08 SURGICAL SUPPLY — 56 items
ADH SKN CLS APL DERMABOND .7 (GAUZE/BANDAGES/DRESSINGS) ×2
APL PRP STRL LF DISP 70% ISPRP (MISCELLANEOUS) ×4
APPLIER CLIP ROT 13.4 12 LRG (CLIP) ×3
APR CLP LRG 13.4X12 ROT 20 MLT (CLIP) ×2
BLADE SURG SZ11 CARB STEEL (BLADE) ×3 IMPLANT
CHLORAPREP W/TINT 26 (MISCELLANEOUS) ×6 IMPLANT
CLIP APPLIE ROT 13.4 12 LRG (CLIP) ×1 IMPLANT
COVER SURGICAL LIGHT HANDLE (MISCELLANEOUS) ×3 IMPLANT
COVER WAND RF STERILE (DRAPES) IMPLANT
DECANTER SPIKE VIAL GLASS SM (MISCELLANEOUS) ×3 IMPLANT
DERMABOND ADVANCED (GAUZE/BANDAGES/DRESSINGS) ×1
DERMABOND ADVANCED .7 DNX12 (GAUZE/BANDAGES/DRESSINGS) ×2 IMPLANT
DRAPE UTILITY XL STRL (DRAPES) ×6 IMPLANT
ELECT REM PT RETURN 15FT ADLT (MISCELLANEOUS) ×3 IMPLANT
GLOVE SRG 8 PF TXTR STRL LF DI (GLOVE) ×2 IMPLANT
GLOVE SURG ENC MOIS LTX SZ7.5 (GLOVE) ×3 IMPLANT
GLOVE SURG UNDER POLY LF SZ8 (GLOVE) ×3
GOWN STRL REUS W/TWL XL LVL3 (GOWN DISPOSABLE) ×6 IMPLANT
GRASPER SUT TROCAR 14GX15 (MISCELLANEOUS) ×3 IMPLANT
IRRIG SUCT STRYKERFLOW 2 WTIP (MISCELLANEOUS) ×3
IRRIGATION SUCT STRKRFLW 2 WTP (MISCELLANEOUS) ×2 IMPLANT
KIT BASIN OR (CUSTOM PROCEDURE TRAY) ×3 IMPLANT
KIT TURNOVER KIT A (KITS) ×3 IMPLANT
MAT PREVALON FULL STRYKER (MISCELLANEOUS) ×3 IMPLANT
NDL SPNL 18GX3.5 QUINCKE PK (NEEDLE) ×2 IMPLANT
NEEDLE SPNL 18GX3.5 QUINCKE PK (NEEDLE) ×3 IMPLANT
PACK UNIVERSAL I (CUSTOM PROCEDURE TRAY) ×3 IMPLANT
RELOAD STAPLE 60 3.8 GOLD REG (STAPLE) IMPLANT
RELOAD STAPLE 60 4.1 GRN THCK (STAPLE) IMPLANT
RELOAD STAPLER BLUE 60MM (STAPLE) ×8 IMPLANT
RELOAD STAPLER GOLD 60MM (STAPLE) ×2 IMPLANT
RELOAD STAPLER GREEN 60MM (STAPLE) ×2 IMPLANT
SCISSORS LAP 5X45 EPIX DISP (ENDOMECHANICALS) ×3 IMPLANT
SEALER TISSUE G2 CVD JAW 45CM (ENDOMECHANICALS) IMPLANT
SET TUBE SMOKE EVAC HIGH FLOW (TUBING) ×3 IMPLANT
SLEEVE ADV FIXATION 5X100MM (TROCAR) ×6 IMPLANT
SLEEVE GASTRECTOMY 40FR VISIGI (MISCELLANEOUS) ×3 IMPLANT
SOL ANTI FOG 6CC (MISCELLANEOUS) ×2 IMPLANT
SOLUTION ANTI FOG 6CC (MISCELLANEOUS) ×1
SPONGE LAP 18X18 RF (DISPOSABLE) ×3 IMPLANT
STAPLER ECHELON BIOABSB 60 FLE (MISCELLANEOUS) IMPLANT
STAPLER ECHELON LONG 60 440 (INSTRUMENTS) ×3 IMPLANT
STAPLER RELOAD BLUE 60MM (STAPLE) ×12
STAPLER RELOAD GOLD 60MM (STAPLE) ×3
STAPLER RELOAD GREEN 60MM (STAPLE) ×3
SUT MNCRL AB 4-0 PS2 18 (SUTURE) ×3 IMPLANT
SUT VICRYL 0 TIES 12 18 (SUTURE) ×3 IMPLANT
SYR 20ML LL LF (SYRINGE) ×3 IMPLANT
SYR 50ML LL SCALE MARK (SYRINGE) ×3 IMPLANT
TOWEL OR 17X26 10 PK STRL BLUE (TOWEL DISPOSABLE) ×3 IMPLANT
TOWEL OR NON WOVEN STRL DISP B (DISPOSABLE) ×3 IMPLANT
TROCAR ADV FIXATION 5X100MM (TROCAR) ×3 IMPLANT
TROCAR BLADELESS 15MM (ENDOMECHANICALS) ×3 IMPLANT
TROCAR BLADELESS OPT 5 100 (ENDOMECHANICALS) ×3 IMPLANT
TUBING CONNECTING 10 (TUBING) ×3 IMPLANT
TUBING ENDO SMARTCAP (MISCELLANEOUS) ×3 IMPLANT

## 2020-06-08 NOTE — Anesthesia Preprocedure Evaluation (Signed)
Anesthesia Evaluation  Patient identified by MRN, date of birth, ID band Patient awake    Reviewed: Allergy & Precautions, H&P , NPO status , Patient's Chart, lab work & pertinent test results  History of Anesthesia Complications (+) PONV  Airway Mallampati: II  TM Distance: >3 FB Neck ROM: Full    Dental no notable dental hx.    Pulmonary neg pulmonary ROS,    Pulmonary exam normal breath sounds clear to auscultation       Cardiovascular negative cardio ROS Normal cardiovascular exam Rhythm:Regular Rate:Normal     Neuro/Psych negative neurological ROS  negative psych ROS   GI/Hepatic Neg liver ROS, hiatal hernia,   Endo/Other  obesity  Renal/GU negative Renal ROS  negative genitourinary   Musculoskeletal negative musculoskeletal ROS (+)   Abdominal   Peds negative pediatric ROS (+)  Hematology negative hematology ROS (+)   Anesthesia Other Findings   Reproductive/Obstetrics negative OB ROS                             Anesthesia Physical Anesthesia Plan  ASA: II  Anesthesia Plan: General   Post-op Pain Management:    Induction: Intravenous  PONV Risk Score and Plan: 4 or greater and Ondansetron, Dexamethasone, Midazolam, Treatment may vary due to age or medical condition and Scopolamine patch - Pre-op  Airway Management Planned: Oral ETT  Additional Equipment:   Intra-op Plan:   Post-operative Plan: Extubation in OR  Informed Consent: I have reviewed the patients History and Physical, chart, labs and discussed the procedure including the risks, benefits and alternatives for the proposed anesthesia with the patient or authorized representative who has indicated his/her understanding and acceptance.     Dental advisory given  Plan Discussed with: CRNA and Surgeon  Anesthesia Plan Comments:         Anesthesia Quick Evaluation

## 2020-06-08 NOTE — Progress Notes (Signed)
Pt ambulated in hallway, voiding, and started her water.

## 2020-06-08 NOTE — Transfer of Care (Signed)
Immediate Anesthesia Transfer of Care Note  Patient: Heidi Olson  Procedure(s) Performed: LAPAROSCOPIC GASTRIC SLEEVE RESECTION (N/A Abdomen) UPPER GI ENDOSCOPY (N/A Esophagus) LAPAROSCOPIC REMOVAL OF GASTRIC BAND, LAP BAND CONVERSION TO LAP SLEEVE GASTRESTECTOMY (N/A Abdomen)  Patient Location: PACU  Anesthesia Type:General  Level of Consciousness: awake and patient cooperative  Airway & Oxygen Therapy: Patient Spontanous Breathing and Patient connected to face mask  Post-op Assessment: Report given to RN and Post -op Vital signs reviewed and stable  Post vital signs: Reviewed and stable  Last Vitals:  Vitals Value Taken Time  BP 143/77 06/08/20 1200  Temp    Pulse 75 06/08/20 1202  Resp 18 06/08/20 1202  SpO2 100 % 06/08/20 1202  Vitals shown include unvalidated device data.  Last Pain:  Vitals:   06/08/20 0854  TempSrc:   PainSc: 0-No pain         Complications: No complications documented.

## 2020-06-08 NOTE — Progress Notes (Addendum)
PHARMACY CONSULT FOR:  Risk Assessment for Post-Discharge VTE Following Bariatric Surgery  Post-Discharge VTE Risk Assessment: This patient's probability of 30-day post-discharge VTE is increased due to the factors marked:   Female    Age >/=60 years    BMI >/=50 kg/m2    CHF    Dyspnea at Rest    Paraplegia  x  Non-gastric-band surgery    Operation Time >/=3 hr    Return to OR     Length of Stay >/= 3 d   Hx of VTE   Hypercoagulable condition   Significant venous stasis       Predicted probability of 30-day post-discharge VTE: - 0.16%   Other patient-specific factors to consider: - No noted history of PE/DVT    Recommendation for Discharge: No pharmacologic prophylaxis post-discharge        Heidi Olson is a 50 y.o. female who underwent conversion of gastric band to sleeve gastrectomy.  on 06/08/2020    Case start: 1013 Case end: 1141   Allergies  Allergen Reactions  . Demerol [Meperidine] Nausea And Vomiting    severe abd pain   . Latex Hives    Patient Measurements: Height: 5\' 8"  (172.7 cm) Weight: 107 kg (236 lb) IBW/kg (Calculated) : 63.9 Body mass index is 35.88 kg/m.  Recent Labs    06/08/20 0840  WBC 4.6  HGB 13.6  HCT 40.6  PLT 298  CREATININE 0.78  ALBUMIN 3.8  PROT 6.9  AST 30  ALT 15  ALKPHOS 60  BILITOT 1.0   Estimated Creatinine Clearance: 105.3 mL/min (by C-G formula based on SCr of 0.78 mg/dL).    Past Medical History:  Diagnosis Date  . Arthritis   . Complication of anesthesia   . Depression   . History of hiatal hernia   . Hyperlipidemia   . Obesity   . PONV (postoperative nausea and vomiting)   . Walking pneumonia      Medications Prior to Admission  Medication Sig Dispense Refill Last Dose  . ALPRAZolam (XANAX) 0.25 MG tablet Take 0.125-0.5 mg by mouth at bedtime as needed for anxiety or sleep.   06/08/2020 at [place]  . FLUoxetine (PROZAC) 20 MG tablet Take 40 mg by mouth daily.   06/08/2020 at [place], PharmD, BCPS 06/08/2020 1:44 PM

## 2020-06-08 NOTE — Brief Op Note (Signed)
C/O nausea; phenergan 6.25 iv given

## 2020-06-08 NOTE — Anesthesia Postprocedure Evaluation (Signed)
Anesthesia Post Note  Patient: [name]  Procedure(Olson) Performed: LAPAROSCOPIC GASTRIC SLEEVE RESECTION (N/A Abdomen) UPPER GI ENDOSCOPY (N/A Esophagus) LAPAROSCOPIC REMOVAL OF GASTRIC BAND, LAP BAND CONVERSION TO LAP SLEEVE GASTRESTECTOMY (N/A Abdomen)     Patient location during evaluation: PACU Anesthesia Type: General Level of consciousness: awake and alert Pain management: pain level controlled Vital Signs Assessment: post-procedure vital signs reviewed and stable Respiratory status: spontaneous breathing, nonlabored ventilation, respiratory function stable and patient connected to nasal cannula oxygen Cardiovascular status: blood pressure returned to baseline and stable Postop Assessment: no apparent nausea or vomiting Anesthetic complications: no   No complications documented.  Last Vitals:  Vitals:   06/08/20 1215 06/08/20 1230  BP: (!) 146/73 (!) 151/76  Pulse: 83 79  Resp: 17 16  Temp:    SpO2: 100% 100%    Last Pain:  Vitals:   06/08/20 1230  TempSrc:   PainSc: 8                  Heidi Olson

## 2020-06-08 NOTE — Op Note (Signed)
Patient: Heidi Olson (07/04/1970, [id number])  Date of Surgery: 06/08/2020   Preoperative Diagnosis: Morbid obesity with malfunctioning laparoscopic adjustable gastric band  Postoperative Diagnosis: Morbid obesity with malfunctioning laparoscopic adjustable gastric band  Surgical Procedure: Laparoscopic removal of laparoscopic adjustable gastric band Laparoscopic sleeve gastrectomy Upper GI endoscopy  Operative Team Members:     * Seema Blum, Nickola Major, MD - Primary    * Kinsinger, Arta Bruce, MD - Assisting   Anesthesiologist: Myrtie Soman, MD CRNA: Claudia Desanctis, CRNA; Gerald Leitz, CRNA   Anesthesia: General   Fluids:  Total I/O In: 1100 [I.V.:1000; IV Piggyback:100] Out: 20 [WVPXT:06]  Complications: None  Drains:  none   Specimen:  ID Type Source Tests Collected by Time Destination  1 : stomach Tissue PATH GI benign resection SURGICAL PATHOLOGY Conna Terada, Nickola Major, MD 06/08/2020 1022      Disposition:  PACU - hemodynamically stable.  Plan of Care: Admit to inpatient     Indications for Procedure: Heidi Olson is a 50 y.o. female who presented with weight regain after laparoscopic adjustable gastric band placement and malfunction of the band.  I recommended laparoscopic sleeve gastrectomy with removal of the laparoscopic adjustable band and upper endoscopy.  She entered the preoperative pathway and after complete work-up she was ready for surgery.  The procedure itself as well as its risk, benefits, and alternatives of surgery were discussed with the patient who granted consent to proceed.  The risk discussed include were not limited to the risk of infection, bleeding, damage nearby structures, leak, and the need for additional surgeries or procedures.  Findings: Adjustable gastric band removed in its entirety  Description of Procedure: On the date stated above patient taken operating room suite placed supine position.  General endotracheal anesthesia  was induced.  Antibiotics were given prior the case start.  SCDs were placed lower extremities.  A timeout was completed verifying the correct patient, procedure, position, and equipment needed for the case.  The patient's abdomen was prepped and draped in usual sterile fashion.  I began by making a 5 mm incision through her previous laparoscopic incision in the left midabdomen and inserting the 5 mm trocar into the abdomen under optical guidance.  The abdomen is insufflated 15 mmHg.  The laparoscope was inserted and the abdomen was inspected, there was no trauma with initial trocar placement.  A tap block was performed under direct vision using a mixture of Marcaine and Exparel bilaterally.  4 additional trochars were placed across the abdomen 2 on each side for the surgeon and assistant.  These were placed under direct vision without any trauma to the underlying viscera.  The right hand for the surgeon was a 12 mm trocar otherwise all trochars were 5 mm.  The liver retractor was inserted in the epigastric area and fixed to the bed.  The liver was able to be retracted off the stomach without significant adhesions.  At this point we placed patient had a position and began our dissection of the gastric band.  The harmonic scalpel was used to dissect the cicatrix off of the band.  And the latch of the band was divided using the harmonic scalpel.  This freed the band from its position around the stomach and was able to be removed through the 12 mm trocar.  We then worked to divide the plicating sutures that hold the fundus over the band.  The cicatrix in this area was also divided to define the anatomy of the stomach.  The adhesions between the fundus and the diaphragm were divided.  This dissection was carried up to the angle of Hiss which was cleared off of the diaphragm to define the edge of the esophagus.  With the band removed and the stomach freed from the scar tissue in the left upper quadrant, Dr. Kieth Brightly  and I felt comfortable proceeding with sleeve gastrectomy.  We moved to the greater curve of the stomach and began our dissection of the gastrocolic ligament near the avascular area between the gastroepiploic's and the short gastrics.  We then divided the gastroepiploic's down to about 6 cm proximal to the pylorus.  We then divided the short gastrics along the remainder of the greater curve the stomach.  This dissection was completed with the harmonic scalpel.  Some attachments between the stomach and the pancreas were divided using the harmonic scalpel.  At this point the stomach was fully mobilized and ready for sleeve resection.  The nurse anesthetist placed the 65 French VISIGI into the stomach and we positioned it against the lesser curve, and placed it to suction.  Starting about 6 cm from the pylorus on the greater curve, I began my sleeve gastrectomy with a green load of the Ethicon 60 mm linear stapler, this was followed by 1 gold load and then 4 blue loads.  As I divide the stomach I was careful to maintain equal portion of the anterior posterior stomach in the sleeve, and to stay safely away from the angularis.  The scar tissue in the area of the removed band, required an additional staple load to complete the sleeve gastrectomy.  With the sleeve complete, the anesthesiologist insufflated air into the stomach using the VISI G to test the staple line, there were no bubbles from the staple line.  A few areas of bleeding were controlled using laparoscopic clips along the staple line.  The VISI G was removed and the upper endoscopy was performed by my partner Dr. Kieth Brightly.  The anatomy of the sleeve appeared appropriate and there was no significant intraluminal bleeding, so the endoscope was removed and we proceeded with closing.  The gastric specimen was removed through the 12 mm port site.  The port of the previous gastric band was removed from the subcutaneous tissue using electrocautery, being  careful to remove the 3 Prolene fixating sutures.  Hemostasis was obtained in this wound using electrocautery.  The 12 mm trocar site was closed using 0 Vicryl on a PDS suture passer.  The liver retractor was removed.  The abdomen was inspected and then desufflated.  All trochars were removed and the skin was closed using 4-0 Monocryl and Dermabond.  All sponge needle counts correct at the end this case.    Louanna Raw, MD General, Bariatric, & Minimally Invasive Surgery [facility], [place]

## 2020-06-08 NOTE — Progress Notes (Signed)
Discussed post op day goals with patient including ambulation, IS, diet progression, pain, and nausea control.  BSTOP education provided including BSTOP information guide, "Guide for Pain Management after your Bariatric Procedure".  Questions answered. 

## 2020-06-08 NOTE — H&P (Signed)
Admitting Physician: Nickola Major Dionel Archey  Service: Bariatric Surgery  CC: Band malfunction  Subjective   HPI: Heidi Olson is an 50 y.o. female who is here for elective conversion of gastric band to sleeve gastrectomy.  Past Medical History:  Diagnosis Date  . Arthritis   . Complication of anesthesia   . Depression   . History of hiatal hernia   . Hyperlipidemia   . Obesity   . PONV (postoperative nausea and vomiting)   . Walking pneumonia     Past Surgical History:  Procedure Laterality Date  . BIOPSY  05/18/2020   Procedure: BIOPSY;  Surgeon: Felicie Morn, MD;  Location: [facility];  Service: General;;  . BREAST REDUCTION SURGERY    . CHOLECYSTECTOMY    . ESOPHAGOGASTRODUODENOSCOPY N/A 05/18/2020   Procedure: ESOPHAGOGASTRODUODENOSCOPY (EGD) WITH BIOSPY;  Surgeon: Felicie Morn, MD;  Location: [facility];  Service: General;  Laterality: N/A;  . LAPAROSCOPIC GASTRIC BANDING    . ROTATOR CUFF REPAIR     07-2019   right  . TONSILLECTOMY      Family History  Problem Relation Age of Onset  . Cancer Maternal Grandmother        lung  . Heart disease Maternal Grandmother   . Cancer Maternal Grandfather        esophageal  . Esophageal cancer Maternal Grandfather   . Colon cancer Neg Hx   . Colon polyps Neg Hx   . Rectal cancer Neg Hx   . Stomach cancer Neg Hx     Social:  reports that she has never smoked. She has never used smokeless tobacco. She reports that she does not drink alcohol and does not use drugs.  Allergies:  Allergies  Allergen Reactions  . Demerol [Meperidine] Nausea And Vomiting    severe abd pain   . Latex Hives    Medications: Current Outpatient Medications  Medication Instructions  . ALPRAZolam (XANAX) 0.125-0.5 mg, Oral, At bedtime PRN  . Calcium Carbonate-Vitamin D (CALCIUM 600+D PO) 1 capsule, Oral, Daily  . FLUoxetine (PROZAC) 40 mg, Oral, Daily  . Multiple Vitamin (MULTIVITAMIN) capsule 2 capsules, Oral, Daily,  Woman's vitamin  . Norlyda 0.35 mg, Oral, Daily    ROS - all of the below systems have been reviewed with the patient and positives are indicated with bold text General: chills, fever or night sweats Eyes: blurry vision or double vision ENT: epistaxis or sore throat Allergy/Immunology: itchy/watery eyes or nasal congestion Hematologic/Lymphatic: bleeding problems, blood clots or swollen lymph nodes Endocrine: temperature intolerance or unexpected weight changes Breast: new or changing breast lumps or nipple discharge Resp: cough, shortness of breath, or wheezing CV: chest pain or dyspnea on exertion GI: as per HPI GU: dysuria, trouble voiding, or hematuria MSK: joint pain or joint stiffness Neuro: TIA or stroke symptoms Derm: pruritus and skin lesion changes Psych: anxiety and depression  Objective   PE Blood pressure (!) 144/82, pulse 79, temperature 98 F (36.7 C), temperature source Oral, resp. rate 16, weight 107 kg, last menstrual period 06/03/2020, SpO2 96 %. Constitutional: NAD; conversant; no deformities Eyes: Moist conjunctiva; no lid lag; anicteric; PERRL Neck: Trachea midline; no thyromegaly Lungs: Normal respiratory effort; no tactile fremitus CV: RRR; no palpable thrills; no pitting edema GI: Abd Soft, non-tender, non-distended; no palpable hepatosplenomegaly MSK: Normal range of motion of extremities; no clubbing/cyanosis Psychiatric: Appropriate affect; alert and oriented x3 Lymphatic: No palpable cervical or axillary lymphadenopathy  No results found for this or any previous visit (  from the past 24 hour(s)).  Imaging Orders  No imaging studies ordered today     Assessment and Plan   Heidi Olson is an 50 y.o. female with a malfunctioning gastric band here for removal of gastric band and conversion to sleeve gastrectomy.    The risks, benefits and alternatives of surgery were discussed with the patient on multiple occasions who granted consent to  proceed.  We will proceed to the operating room as scheduled.    Felicie Morn, MD  [facility] Use [email or web address] to contact on call provider

## 2020-06-08 NOTE — Op Note (Signed)
Preoperative diagnosis: laparoscopic sleeve gastrectomy  Postoperative diagnosis: Same   Procedure: Upper endoscopy   Surgeon: Gurney Maxin, M.D.  Anesthesia: Gen.   Indications for procedure: This patient was undergoing a laparoscopic sleeve gastrectomy.   Description of procedure: The endoscopy was placed in the mouth and into the oropharynx and under endoscopic vision it was advanced to the esophagogastric junction. The pouch was insufflated and no bleeding or bubbles were seen. The GEJ was identified at 38 cm from the teeth. No bleeding or leaks were detected. The scope was withdrawn without difficulty.   Gurney Maxin, M.D. General, Bariatric, & Minimally Invasive Surgery [facility]

## 2020-06-08 NOTE — Anesthesia Procedure Notes (Signed)
Procedure Name: Intubation Date/Time: 06/08/2020 10:00 AM Performed by: Claudia Desanctis, CRNA Pre-anesthesia Checklist: Patient identified, Emergency Drugs available, Suction available and Patient being monitored Patient Re-evaluated:Patient Re-evaluated prior to induction Oxygen Delivery Method: Circle system utilized Preoxygenation: Pre-oxygenation with 100% oxygen Induction Type: IV induction Ventilation: Mask ventilation without difficulty Laryngoscope Size: 2 and Miller Grade View: Grade I Tube type: Oral Tube size: 7.0 mm Number of attempts: 1 Airway Equipment and Method: Stylet Placement Confirmation: ETT inserted through vocal cords under direct vision,  positive ETCO2 and breath sounds checked- equal and bilateral Secured at: 21 cm Tube secured with: Tape Dental Injury: Teeth and Oropharynx as per pre-operative assessment

## 2020-06-09 ENCOUNTER — Encounter (HOSPITAL_COMMUNITY): Payer: Self-pay | Admitting: Surgery

## 2020-06-09 ENCOUNTER — Other Ambulatory Visit (HOSPITAL_COMMUNITY): Payer: Self-pay | Admitting: Surgery

## 2020-06-09 LAB — CBC WITH DIFFERENTIAL/PLATELET
Abs Immature Granulocytes: 0.03 10*3/uL (ref 0.00–0.07)
Basophils Absolute: 0 10*3/uL (ref 0.0–0.1)
Basophils Relative: 0 %
Eosinophils Absolute: 0 10*3/uL (ref 0.0–0.5)
Eosinophils Relative: 0 %
HCT: 37.8 % (ref 36.0–46.0)
Hemoglobin: 12.3 g/dL (ref 12.0–15.0)
Immature Granulocytes: 0 %
Lymphocytes Relative: 10 %
Lymphs Abs: 0.9 10*3/uL (ref 0.7–4.0)
MCH: 30.5 pg (ref 26.0–34.0)
MCHC: 32.5 g/dL (ref 30.0–36.0)
MCV: 93.8 fL (ref 80.0–100.0)
Monocytes Absolute: 0.7 10*3/uL (ref 0.1–1.0)
Monocytes Relative: 8 %
Neutro Abs: 6.9 10*3/uL (ref 1.7–7.7)
Neutrophils Relative %: 82 %
Platelets: 266 10*3/uL (ref 150–400)
RBC: 4.03 MIL/uL (ref 3.87–5.11)
RDW: 12.4 % (ref 11.5–15.5)
WBC: 8.5 10*3/uL (ref 4.0–10.5)
nRBC: 0 % (ref 0.0–0.2)

## 2020-06-09 LAB — SURGICAL PATHOLOGY

## 2020-06-09 MED ORDER — OXYCODONE-ACETAMINOPHEN 5-325 MG PO TABS: 1.0000 | ORAL_TABLET | ORAL | 0 refills | Status: DC | PRN

## 2020-06-09 MED ORDER — ACETAMINOPHEN 500 MG PO TABS: 500.0000 mg | ORAL_TABLET | Freq: Four times a day (QID) | ORAL | 0 refills | Status: DC | PRN

## 2020-06-09 MED ORDER — PANTOPRAZOLE SODIUM 40 MG PO TBEC: 40.0000 mg | DELAYED_RELEASE_TABLET | Freq: Every day | ORAL | 1 refills | Status: DC

## 2020-06-09 MED ORDER — ONDANSETRON HCL 4 MG PO TABS: 4.0000 mg | ORAL_TABLET | Freq: Every day | ORAL | 1 refills | Status: DC | PRN

## 2020-06-09 MED FILL — PANTOPRAZOLE SOD DR 40 MG T: 40 | 90 days supply | Qty: 90 | Fill #0

## 2020-06-09 MED FILL — OXYCODONE-APAP 5-325MG: 5-325 | 1 days supply | Qty: 10 | Fill #0

## 2020-06-09 MED FILL — ONDANSETRON HCL 4 MG TABS: 4 | 21 days supply | Qty: 18 | Fill #0

## 2020-06-09 NOTE — Progress Notes (Signed)
Patient alert and oriented, pain is controlled. Patient is tolerating fluids, advanced to protein shake today, patient is tolerating well.  Reviewed Gastric sleeve discharge instructions with patient and patient is able to articulate understanding.  Provided information on BELT program, Support Group and [facility] outpatient pharmacy. All questions answered, will continue to monitor.  Total 24hr fluid recall 64mL.  Per dehydration protocol, will call pt to f/u within one week post op

## 2020-06-09 NOTE — Discharge Instructions (Signed)
GASTRIC BYPASS / SLEEVE  Home Care Instructions  These instructions are to help you care for yourself when you go home.  Call: If you have any problems. . Call [phone] and ask for the surgeon on call . If you have an emergency related to your surgery please use the ER at .  . Tell the ER staff that you are a new post-op gastric bypass or gastric sleeve patient   Signs and symptoms to report: . Severe vomiting or nausea o If you cannot handle clear liquids for longer than 1 day, call your surgeon  . Abdominal pain which does not get better after taking your pain medication . Fever greater than 100.4 F and chills . Heart rate over 100 beats a minute . Trouble breathing . Chest pain .  Redness, swelling, drainage, or foul odor at incision (surgical) sites .  If your incisions open or pull apart . Swelling or pain in calf (lower leg) . Diarrhea (Loose bowel movements that happen often), frequent watery, uncontrolled bowel movements . Constipation, (no bowel movements for 3 days) if this happens:  o Take Milk of Magnesia, 2 tablespoons by mouth, 3 times a day for 2 days if needed o Stop taking Milk of Magnesia once you have had a bowel movement o Call your doctor if constipation continues Or o Take Miralax  (instead of Milk of Magnesia) following the label instructions o Stop taking Miralax once you have had a bowel movement o Call your doctor if constipation continues . Anything you think is "abnormal for you"   Normal side effects after surgery: . Unable to sleep at night or unable to concentrate . Irritability . Being tearful (crying) or depressed These are common complaints, possibly related to your anesthesia, stress of surgery and change in lifestyle, that usually go away a few weeks after surgery.  If these feelings continue, call your medical doctor.  Wound Care: You may have surgical glue, steri-strips, or staples over your incisions after surgery . Surgical  glue:  Looks like a clear film over your incisions and will wear off a little at a time . Steri-strips : Adhesive strips of tape over your incisions. You may notice a yellowish color on the skin under the steri-strips. This is used to make the   steri-strips stick better. Do not pull the steri-strips off - let them fall off . Staples: Staples may be removed before you leave the hospital o If you go home with staples, call [facility] at for an appointment with your surgeon's nurse to have staples removed 10 days after surgery, [phone] . Showering: You may shower two (2) days after your surgery unless your surgeon tells you differently o Wash gently around incisions with warm soapy water, rinse well, and gently pat dry  o If you have a drain (tube from your incision), you may need someone to hold this while you shower  o No tub baths until staples are removed and incisions are healed     Medications: . Medications should be liquid or crushed if larger than the size of a dime . Extended release pills (medication that releases a little bit at a time through the day) should not be crushed . Depending on the size and number of medications you take, you may need to space (take a few throughout the day)/change the time you take your medications so that you do not over-fill your pouch (smaller stomach) . Make sure you follow-up with   your primary care physician to make medication changes needed during rapid weight loss and life-style changes . If you have diabetes, follow up with the doctor that orders your diabetes medication(s) within one week after surgery and check your blood sugar regularly. . Do not drive while taking narcotics (pain medications) . DO NOT take NSAID'S (Examples of NSAID's include ibuprofen, naproxen)  Diet:                    First 2 Weeks  You will see the nutritionist about two (2) weeks after your surgery. The nutritionist will increase the types of foods you can  eat if you are handling liquids well: . If you have severe vomiting or nausea and cannot handle clear liquids lasting longer than 1 day, call your surgeon  Protein Shake . Drink at least 2 ounces of shake 5-6 times per day . Each serving of protein shakes (usually 8 - 12 ounces) should have a minimum of:  o 15 grams of protein  o And no more than 5 grams of carbohydrate  . Goal for protein each day: o Men = 80 grams per day o Women = 60 grams per day . Protein powder may be added to fluids such as non-fat milk or Lactaid milk or Soy milk (limit to 35 grams added protein powder per serving)  Hydration . Slowly increase the amount of water and other clear liquids as tolerated (See Acceptable Fluids) . Slowly increase the amount of protein shake as tolerated  .  Sip fluids slowly and throughout the day . May use sugar substitutes in small amounts (no more than 6 - 8 packets per day; i.e. Splenda)  Fluid Goal . The first goal is to drink at least 8 ounces of protein shake/drink per day (or as directed by the nutritionist);  See handout from pre-op Bariatric Education Class for examples of protein shake/drink.   o Slowly increase the amount of protein shake you drink as tolerated o You may find it easier to slowly sip shakes throughout the day o It is important to get your proteins in first . Your fluid goal is to drink 64 - 100 ounces of fluid daily o It may take a few weeks to build up to this . 32 oz (or more) should be clear liquids  And  . 32 oz (or more) should be full liquids (see below for examples) . Liquids should not contain sugar, caffeine, or carbonation  Clear Liquids: . Water or Sugar-free flavored water (i.e. Fruit H2O, Propel) . Decaffeinated coffee or tea (sugar-free) . Heidi Olson, Wyler's Olson, Minute Maid Olson . Sugar-free Jell-O . Bouillon or broth . Sugar-free Popsicle:   *Less than 20 calories each; Limit 1 per day  Full Liquids: Protein Shakes/Drinks + 2  choices per day of other full liquids . Full liquids must be: o No More Than 12 grams of Carbs per serving  o No More Than 3 grams of Fat per serving . Strained low-fat cream soup . Non-Fat milk . Fat-free Lactaid Milk . Sugar-free yogurt (Dannon Olson & Fit, Greek yogurt)      Vitamins and Minerals . Start 1 day after surgery unless otherwise directed by your surgeon . Bariatric Specific Complete Multivitamins . Chewable Calcium Citrate with Vitamin D-3 (Example: 3 Chewable Calcium Plus 600 with Vitamin D-3) o Take 500 mg three (3) times a day for a total of 1500 mg each day o Do not take all 3 doses   of calcium at one time as it may cause constipation, and you can only absorb 500 mg  at a time  o Do not mix multivitamins containing iron with calcium supplements; take 2 hours apart  . Menstruating women and those at risk for anemia (a blood disease that causes weakness) may need extra iron o Talk with your doctor to see if you need more iron . If you need extra iron: Total daily Iron recommendation (including Vitamins) is 50 to 100 mg Iron/day . Do not stop taking or change any vitamins or minerals until you talk to your nutritionist or surgeon . Your nutritionist and/or surgeon must approve all vitamin and mineral supplements   Activity and Exercise: It is important to continue walking at home.  Limit your physical activity as instructed by your doctor.  During this time, use these guidelines: . Do not lift anything greater than ten (10) pounds for at least two (2) weeks . Do not go back to work or drive until your surgeon says you can . You may have sex when you feel comfortable  o It is VERY important for female patients to use a reliable birth control method; fertility often increases after surgery  o Do not get pregnant for at least 18 months . Start exercising as soon as your doctor tells you that you can o Make sure your doctor approves any physical activity . Start with a  simple walking program . Walk 5-15 minutes each day, 7 days per week.  . Slowly increase until you are walking 30-45 minutes per day Consider joining our BELT program. [phone] or email [email or web address]   Special Instructions Things to remember:  . Use your CPAP when sleeping if this applies to you, do not stop the use of CPAP unless directed by physician after a sleep study . [facility] has a free Bariatric Surgery Support Group that meets monthly, the 3rd Thursday, 6 pm.  Please review discharge information for date and location of this meeting. . It is very important to keep all follow up appointments with your surgeon, nutritionist, primary care physician, and behavioral health practitioner o After the first year, please follow up with your bariatric surgeon and nutritionist at least once a year in order to maintain best weight loss results   [facility]: [phone] [facility]: [phone] Bariatric Nurse Coordinator: [phone]      

## 2020-06-09 NOTE — Discharge Summary (Signed)
  Patient ID: Rylah Fukuda [id number] 50 y.o. Jul 13, 1970  06/08/2020  Discharge date and time: 06/09/2020  Admitting Physician: [place]  Discharge Physician: [place]  Admission Diagnoses: Morbid obesity ([place]) [E66.01] Patient Active Problem List   Diagnosis Date Noted  . Morbid obesity ([place]) 06/08/2020  . Obesity   . LAP-BAND surgery status 04/26/2011     Discharge Diagnoses: morbid obesity, malfunctioning gastric band Patient Active Problem List   Diagnosis Date Noted  . Morbid obesity ([place]) 06/08/2020  . Obesity   . LAP-BAND surgery status 04/26/2011    Operations: Procedure(s): LAPAROSCOPIC GASTRIC SLEEVE RESECTION UPPER GI ENDOSCOPY LAPAROSCOPIC REMOVAL OF GASTRIC BAND, LAP BAND CONVERSION TO LAP SLEEVE GASTRESTECTOMY  Admission Condition: good  Discharged Condition: good  Indication for Admission: Morbid obesity and malfunctioning adjustable gastric band  Hospital Course: Ms. Fry is a 50 year old female with a malfunctioning gastric band and weight regain with morbid obesity.  She underwent laparoscopic removal of her gastric band and conversion to sleeve gastrectomy on 06/08/2020.  She recovered well and was discharged the following day tolerating 4 oz of fluid per hour, ambulating, pain and nausea controlled and practicing her incentive spirometer.  She will follow up with [place] in office.  Consults: None  Significant Diagnostic Studies: None  Treatments: surgery: as above  Disposition: Home  Patient Instructions:  Allergies as of 06/09/2020      Reactions   Demerol [meperidine] Nausea And Vomiting   severe abd pain    Latex Hives      Medication List    TAKE these medications   acetaminophen 500 MG tablet Commonly known as: TYLENOL Take 1 tablet (500 mg total) by mouth every 6 (six) hours as needed.   ALPRAZolam 0.25 MG tablet Commonly known as: XANAX Take 0.125-0.5 mg by mouth at bedtime as needed for anxiety or sleep.    FLUoxetine 20 MG tablet Commonly known as: PROZAC Take 40 mg by mouth daily.   ondansetron 4 MG tablet Commonly known as: Zofran Take 1 tablet (4 mg total) by mouth daily as needed for nausea or vomiting.   oxyCODONE-acetaminophen 5-325 MG tablet Commonly known as: Percocet Take 1 tablet by mouth every 4 (four) hours as needed for severe pain.   pantoprazole 40 MG tablet Commonly known as: Protonix Take 1 tablet (40 mg total) by mouth daily.       Activity: no heavy lifting for 4 weeks Diet: bariatric full liquid diet Wound Care: keep wound clean and dry  Follow-up:  With Dr. Thermon Leyland as previously scheduled.  Signed: [place], [place]   06/09/2020, 2:19 PM

## 2020-06-09 NOTE — Progress Notes (Signed)
Progress Note: General Surgery Service   Chief Complaint/Subjective: Doing well after surgery yesterday.  Walked.  Drinking liquids.  Nausea has passed after PACU.  Anticipating discharge later today.  Objective: Vital signs in last 24 hours: Temp:  [97.6 F (36.4 C)-98.9 F (37.2 C)] 98 F (36.7 C) (03/02 0541) Pulse Rate:  [72-99] 83 (03/02 0541) Resp:  [13-21] 18 (03/02 0541) BP: (113-152)/(67-89) 113/67 (03/02 0541) SpO2:  [94 %-100 %] 96 % (03/02 0541) Weight:  [425 kg] 107 kg (03/01 0832) Last BM Date: 06/08/20  Intake/Output from previous day: 03/01 0701 - 03/02 0700 In: 2856.5 [P.O.:540; I.V.:2216.5; IV Piggyback:100] Out: 1245 [Urine:1225; Blood:20] Intake/Output this shift: No intake/output data recorded.  Constitutional: NAD; conversant; no deformities Eyes: Moist conjunctiva; no lid lag; anicteric; PERRL Neck: Trachea midline; no thyromegaly Lungs: Normal respiratory effort; no tactile fremitus CV: RRR; no palpable thrills; no pitting edema GI: Abd Soft, tender to palpation around incisions.  Larger subq port removal incision most tender. no palpable hepatosplenomegaly MSK: Normal range of motion of extremities; no clubbing/cyanosis Psychiatric: Appropriate affect; alert and oriented x3 Lymphatic: No palpable cervical or axillary lymphadenopathy  Lab Results: CBC  Recent Labs    06/08/20 0840 06/08/20 1538 06/09/20 0423  WBC 4.6  --  8.5  HGB 13.6 13.4 12.3  HCT 40.6 41.7 37.8  PLT 298  --  266   BMET Recent Labs    06/08/20 0840  NA 140  K 4.3  CL 105  CO2 27  GLUCOSE 92  BUN 13  CREATININE 0.78  CALCIUM 9.3   PT/INR No results for input(s): LABPROT, INR in the last 72 hours. ABG No results for input(s): PHART, HCO3 in the last 72 hours.  Invalid input(s): PCO2, PO2  Anti-infectives: Anti-infectives (From admission, onward)   Start     Dose/Rate Route Frequency Ordered Stop   06/08/20 0830  cefoTEtan (CEFOTAN) 2 g in sodium chloride  0.9 % 100 mL IVPB        2 g 200 mL/hr over 30 Minutes Intravenous On call to O.R. 06/08/20 0821 06/08/20 1012      Medications: Scheduled Meds: . acetaminophen  1,000 mg Oral Q8H   Or  . acetaminophen (TYLENOL) oral liquid 160 mg/5 mL  1,000 mg Oral Q8H  . FLUoxetine  40 mg Oral Daily  . heparin injection (subcutaneous)  5,000 Units Subcutaneous Q8H  . pantoprazole (PROTONIX) IV  40 mg Intravenous QHS  . Ensure Max Protein  2 oz Oral Q2H   Continuous Infusions: . lactated ringers 75 mL/hr at 06/08/20 2028   PRN Meds:.ALPRAZolam, HYDROmorphone (DILAUDID) injection, ondansetron (ZOFRAN) IV, oxyCODONE, prochlorperazine  Assessment/Plan: Ms. Benning is a 50 year old female s/p laparoscopic removal of adjustable gastric band and conversion to sleeve gastrectomy on 06/08/2020.  Doing well post operative day 1 Starting liquids Ambulating Pain controlled Nausea controlled Plan for discharge later today if tolerating PO intake No pharmacologic prophylaxis recommended post-discharge    LOS: 1 day   FEN: IVF, bariatric liquids ID: none VTE: scds, heparin Foley: none Dispo: possibly home this afternoon if tolerating liquids  Felicie Morn, MD  [facility] Use [email or web address] to contact on call provider

## 2020-06-09 NOTE — Plan of Care (Signed)
  Problem: Education: Goal: Ability to state signs and symptoms to report to health care provider will improve Outcome: Adequate for Discharge Goal: Knowledge of the prescribed self-care regimen will improve Outcome: Adequate for Discharge Goal: Knowledge of discharge needs will improve Outcome: Adequate for Discharge   Problem: Activity: Goal: Ability to tolerate increased activity will improve Outcome: Adequate for Discharge   Problem: Bowel/Gastric: Goal: Gastrointestinal status for postoperative course will improve Outcome: Adequate for Discharge Goal: Occurrences of nausea will decrease Outcome: Adequate for Discharge

## 2020-06-09 NOTE — Progress Notes (Signed)
Nutrition Education Note ° °Received consult for diet education for patient s/p bariatric surgery. ° °Discussed 2 week post op diet with pt. Emphasized that liquids must be non carbonated, non caffeinated, and sugar free. Fluid goals discussed. Pt to follow up with outpatient bariatric RD for further diet progression after 2 weeks. Multivitamins and minerals also reviewed. Teach back method used, pt expressed understanding, expect good compliance. ° °If nutrition issues arise, please consult RD. ° °Heidi Amos, MS, RD, LDN °Inpatient Clinical Dietitian °Contact information available via Amion ° ° °

## 2020-06-09 NOTE — Progress Notes (Signed)
Patient alert and oriented, Post op day 1.  Provided support and encouragement.  Encouraged pulmonary toilet, ambulation and small sips of liquids.  All questions answered.  Will continue to monitor. 

## 2020-06-15 ENCOUNTER — Telehealth (HOSPITAL_COMMUNITY): Payer: Self-pay | Admitting: *Deleted

## 2020-06-15 NOTE — Telephone Encounter (Signed)
1.  Tell me about your pain and pain management? Pt denies any pain.   2.  Let's talk about fluid intake.  How much total fluid are you taking in? Pt states that s/he is getting in at least 64oz of fluid including protein shakes and bottled water.  3.  How much protein have you taken in the last 2 days? Pt states she is working to meet her goal of 60g of protein with the protein shakes and protein powder. Pt instructed to prioritize protein intake first and then supplement remaining oz with clear liquid options.   4.  Have you had nausea?  Tell me about when have experienced nausea and what you did to help? Pt denies nausea.   5.  Has the frequency or color changed with your urine? Pt states that she is urinating "fine" with no changes in frequency or urgency.     6.  Tell me what your incisions look like? "Incisions look good".  Pt denies a fever, chills.  Pt states that incisions are not swollen, open, or draining.  Pt encouraged to call [facility] if incisions change.   7.  Have you been passing gas? BM? Pt states that she is having BMs. Last BM 06/14/20.     8.  If a problem or question were to arise who would you call?  Do you know contact numbers for [facility], [facility], and [facility]? Pt denies dehydration symptoms.  Pt can describe s/sx of dehydration.  Pt knows to call [facility] for surgical, [facility] for nutrition, and [facility] for non-urgent questions or concerns.   9.  How has the walking going? Pt states she is walking around and able to be active without difficulty.  Will rest when needed.   10.  How are your vitamins and calcium going?  How are you taking them? Pt states that she is taking her supplements and vitamins without difficulty.

## 2020-06-22 ENCOUNTER — Encounter: Payer: BC Managed Care – PPO | Attending: Surgery | Admitting: Skilled Nursing Facility1

## 2020-06-22 ENCOUNTER — Other Ambulatory Visit: Payer: Self-pay

## 2020-06-22 NOTE — Progress Notes (Signed)
2 Week Post-Operative Nutrition Class   Patient was seen on 06/04/18 for Post-Operative Nutrition education at [facility].    Surgery date: 06/08/2020 Surgery type: band to sleeve Start weight at [facility]: 240.5 Weight today: 231.6   Body Composition Scale 06/22/2020  Current Body Weight 231.6  Total Body Fat % 41.6  Visceral Fat 13  Fat-Free Mass % 58.3   Total Body Water % 43.6  Muscle-Mass lbs 33.7  BMI 35.2  Body Fat Displacement          Torso  lbs 59.7         Left Leg  lbs 11.9         Right Leg  lbs 11.9         Left Arm  lbs 5.9         Right Arm   lbs 5.9      The following the learning objectives were met by the patient during this course:  Identifies Phase 3 (Soft, High Proteins) Dietary Goals and will begin from 2 weeks post-operatively to 2 months post-operatively  Identifies appropriate sources of fluids and proteins   Identifies appropriate fat sources and healthy verses unhealthy fat types    States protein recommendations and appropriate sources post-operatively  Identifies the need for appropriate texture modifications, mastication, and bite sizes when consuming solids  Identifies appropriate multivitamin and calcium sources post-operatively  Describes the need for physical activity post-operatively and will follow MD recommendations  States when to call healthcare provider regarding medication questions or post-operative complications   Handouts given during class include:  Phase 3A: Soft, High Protein Diet Handout  Phase 3 High Protein Meals  Healthy Fats   Follow-Up Plan: Patient will follow-up at [facility] in 6 weeks for 2 month post-op nutrition visit for diet advancement per MD.

## 2020-06-28 ENCOUNTER — Telehealth: Payer: Self-pay | Admitting: Skilled Nursing Facility1

## 2020-06-28 NOTE — Telephone Encounter (Signed)
RD called pt to verify fluid intake once starting soft, solid proteins 2 week post-bariatric surgery.   Daily Fluid intake: 50-55 oz Daily Protein intake: 60g  Concerns/issues:   None stated

## 2020-08-04 ENCOUNTER — Encounter: Payer: BC Managed Care – PPO | Attending: Surgery | Admitting: Skilled Nursing Facility1

## 2020-08-04 ENCOUNTER — Other Ambulatory Visit: Payer: Self-pay

## 2020-08-04 DIAGNOSIS — E669 Obesity, unspecified: Secondary | ICD-10-CM | POA: Insufficient documentation

## 2020-08-04 NOTE — Progress Notes (Signed)
Bariatric Nutrition Follow-Up Visit Medical Nutrition Therapy   2 Months Post-Operative sleeve from band Surgery  NUTRITION ASSESSMENT   Anthropometrics  Surgery date: 06/08/2020 Surgery type: band to sleeve Start weight at [facility]: 240.5 Weight today: 231.6   Body Composition Scale 06/22/2020 08/04/2020  Current Body Weight 231.6 231.6  Total Body Fat % 41.6 40.8  Visceral Fat 13 12  Fat-Free Mass % 58.3 59.1   Total Body Water % 43.6 44  Muscle-Mass lbs 33.7 34.6  BMI 35.2 34  Body Fat Displacement           Torso  lbs 59.7 58.6         Left Leg  lbs 11.9 11.7         Right Leg  lbs 11.9 11.7         Left Arm  lbs 5.9 5.8         Right Arm   lbs 5.9 5.8   Clinical  Medical hx:  Medications:  Labs:    Lifestyle & Dietary Hx  Pt state she has tried baked potato stating she knows that was outside of the phase. Pt states she tried edamame and liked them. Pt states she realizes when she exercises she has better bowel movements. Pt state she has been trying to eat slower. Pt states she wishes her weight was coming off faster. Pt states she has eaten fruit as well.   Estimated daily fluid intake: 64+ oz Estimated daily protein intake: 60+ g Supplements: multi and calcium  Current average weekly physical activity: elliptical and recumbent 3-4 days a week 30 minutes   24-Hr Dietary Recall: eating food prepared outside of the home 2+ times a week First Meal: 2 bacon + scrambled egg Snack: hummus + cracker or cheese Second Meal: breaded chicken tender + pinto beans or pack of tuna Snack: protein drink Third Meal: 6 shrimp or breaded tilapia  Snack: sugar free gummy bears or sugar free jello Beverages: coffee + 4 grams protein almond milk creamer (unsure sugar amount does not like the 0 sugar), unsweet tea + lemon, water  Post-Op Goals/ Signs/ Symptoms Using straws: no Drinking while eating: no Chewing/swallowing difficulties: no Changes in vision: no Changes to  mood/headaches: no Hair loss/changes to skin/nails: no Difficulty focusing/concentrating: no Sweating: no Dizziness/lightheadedness: no Palpitations: no  Carbonated/caffeinated beverages: no N/V/D/C/Gas: every other day bowel movement with bloat and strain Abdominal pain: no Dumping syndrome: no    NUTRITION DIAGNOSIS  Overweight/obesity ([place]-3.3) related to past poor dietary habits and physical inactivity as evidenced by completed bariatric surgery and following dietary guidelines for continued weight loss and healthy nutrition status.     NUTRITION INTERVENTION Nutrition counseling (C-1) and education (E-2) to facilitate bariatric surgery goals, including: . Diet advancement to the next phase (phase 4) now including non starchy vegetables . The importance of consuming adequate calories as well as certain nutrients daily due to the body's need for essential vitamins, minerals, and fats . The importance of daily physical activity and to reach a goal of at least 150 minutes of moderate to vigorous physical activity weekly (or as directed by their physician) due to benefits such as increased musculature and improved lab values . The importance of intuitive eating specifically learning hunger-satiety cues and understanding the importance of learning a new body: The importance of mindful eating to avoid grazing behaviors . Reorient to the appropriate serving size not what is compared to previous serving sizes  Handouts Provided Include  Phase 4  Learning Style & Readiness for Change Teaching method utilized: Visual & Auditory  Demonstrated degree of understanding via: Teach Back  Readiness Level: Contemplative Barriers to learning/adherence to lifestyle change: learned behaviors over time  RD's Notes for Next Visit . Assess adherence to pt chosen goals   MONITORING & EVALUATION Dietary intake, weekly physical activity, body weight  Next Steps Patient is to follow-up in 3-4  months

## 2020-10-19 ENCOUNTER — Ambulatory Visit: Payer: BC Managed Care – PPO

## 2021-01-11 IMAGING — DX DG CHEST 2V
2 series · 2 of 2 positions shown · non-contrast
Comparison: 02/27/2008

CLINICAL DATA: Status post gastric banding preop gastric band
removal with possible gastric sleeve

EXAM:
CHEST - 2 VIEW

[chest pa]
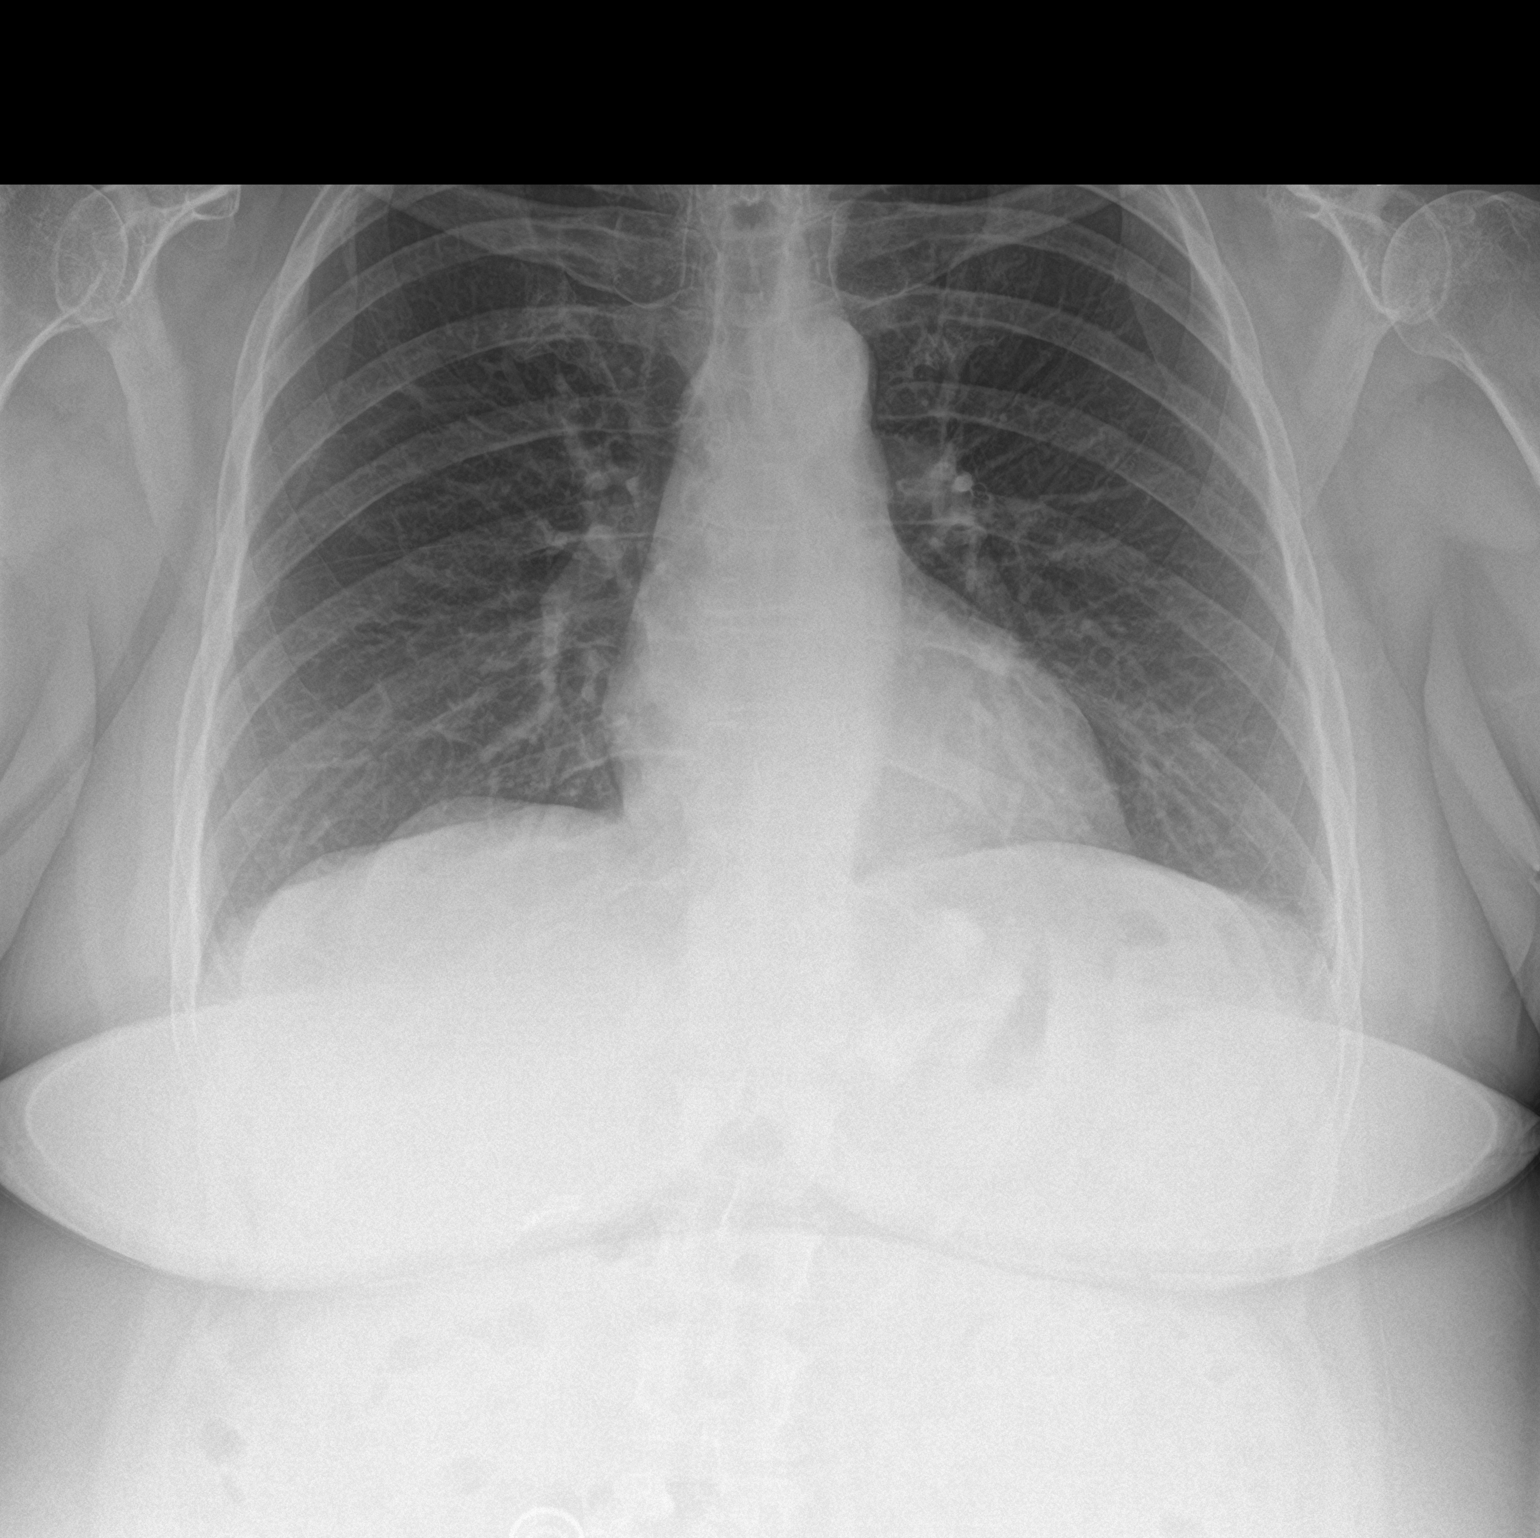

[chest lat]
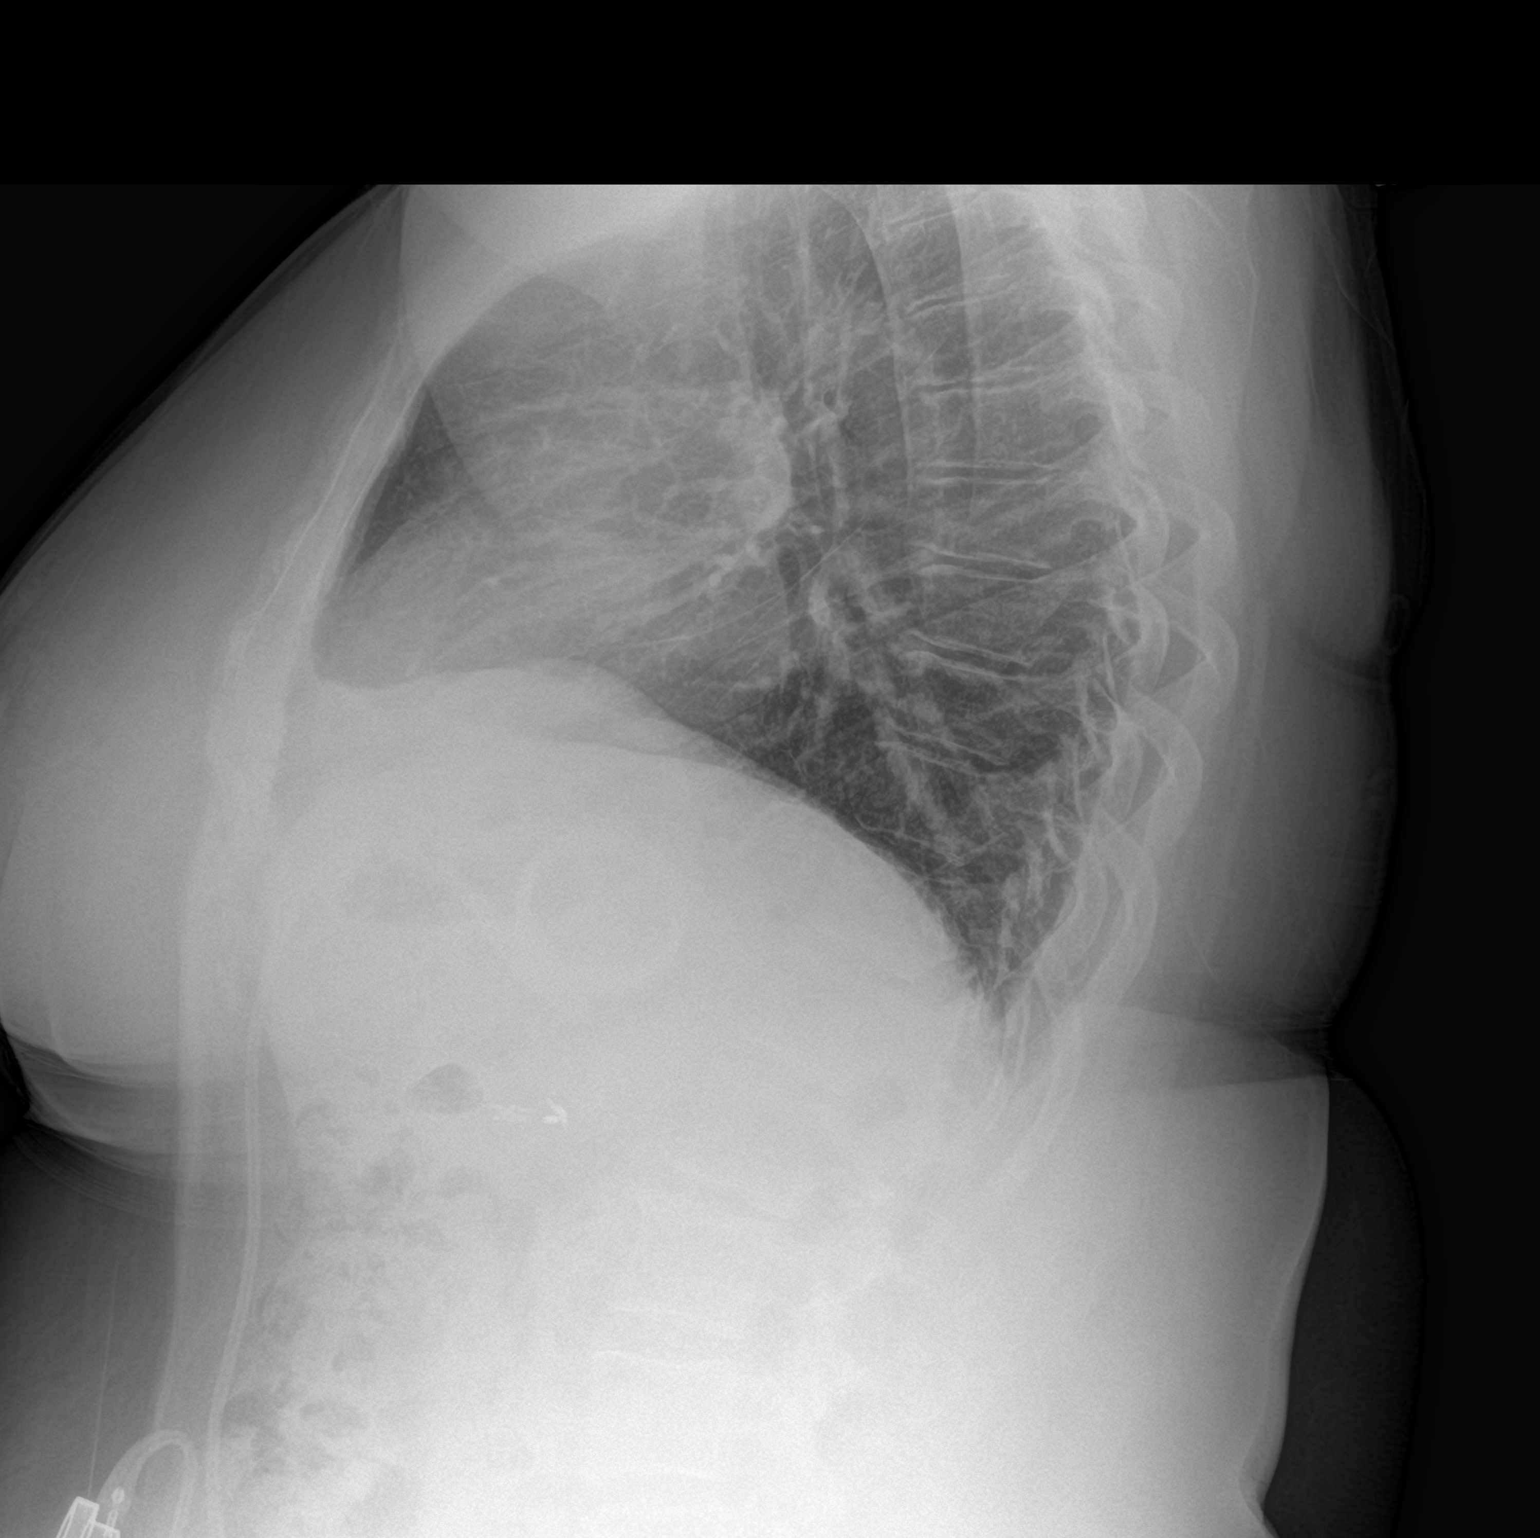

[2 of 2 positions shown; findings below may reference images not displayed]

FINDINGS: The heart size and mediastinal contours are within normal limits.
Both lungs are clear. The visualized skeletal structures are
unremarkable. Gastric band in the left upper quadrant.
IMPRESSION: No active cardiopulmonary disease.

## 2021-01-11 IMAGING — RF DG UGI W SINGLE CM
8 of 14 series · 12 of 24 positions shown · non-contrast
Comparison: None.

CLINICAL DATA: Weight gain, abdominal pain, preop evaluation
gastric band removal and possible gastric sleeve

EXAM:
UPPER GI SERIES WITH KUB
TECHNIQUE: After obtaining a scout radiograph a routine upper GI series was
performed using thin and high density barium.
FLUOROSCOPY TIME:  Fluoroscopy Time:  1 minutes 36 seconds
Radiation Exposure Index (if provided by the fluoroscopic device):
42.9 mGy

[Series 2: cp_standard · 0.52mm/px · 2 of 309 frames shown (1 of 8)]
[frame 30/309]
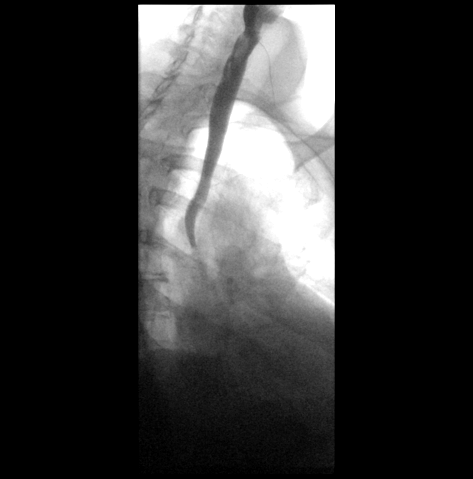
[frame 155/309]
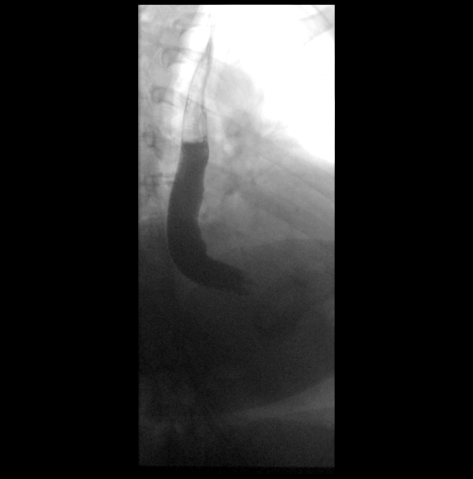

[Series 3: cp_standard · 0.52mm/px · 2 of 200 frames shown (2 of 8)]
[frame 31/200]
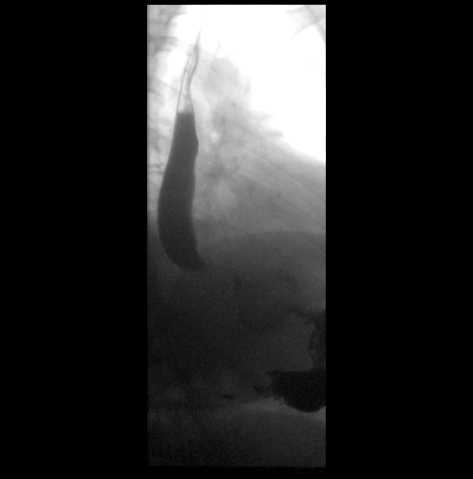
[frame 171/200]
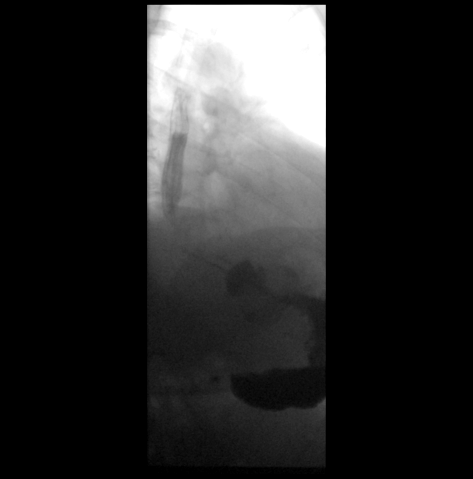

[Series 5: cp_standard · 0.52mm/px · 2 of 151 frames shown (3 of 8)]
[frame 23/151]
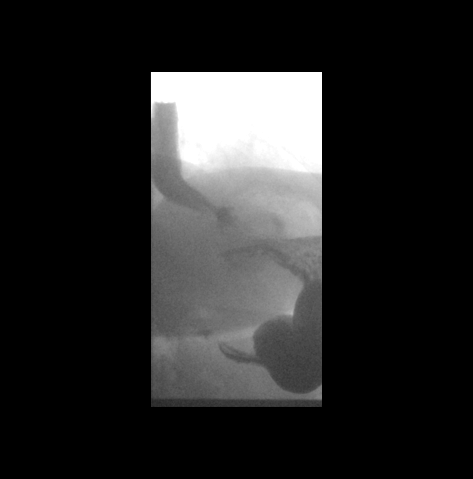
[frame 129/151]
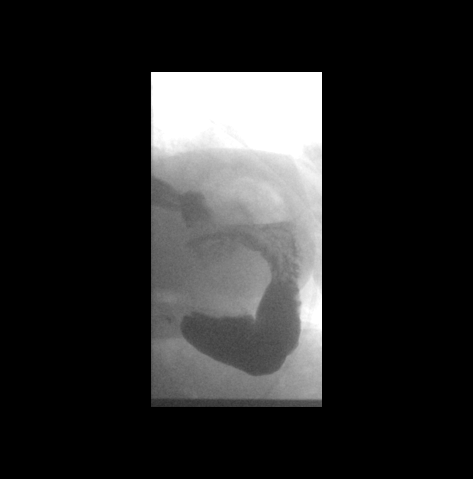

[Series 6: cp_standard · 0.52mm/px · 2 of 60 frames shown (4 of 8)]
[frame 10/60]
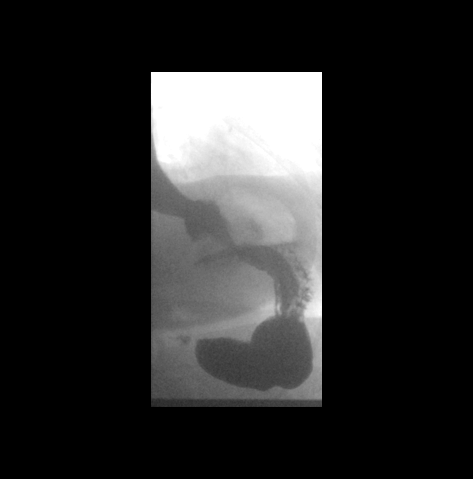
[frame 52/60]
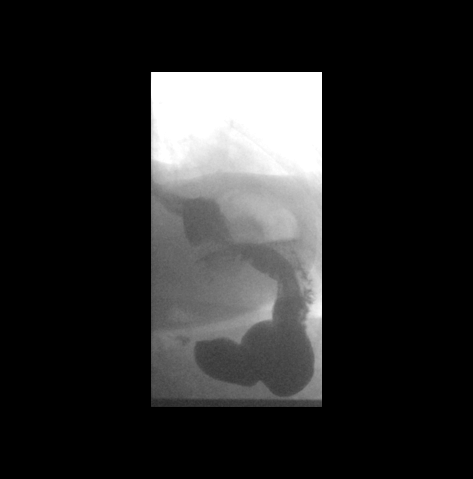

[Series 8: cp_standard · 0.27mm/px · 1 of 1 slices shown (5 of 8)]
[im 1/1]
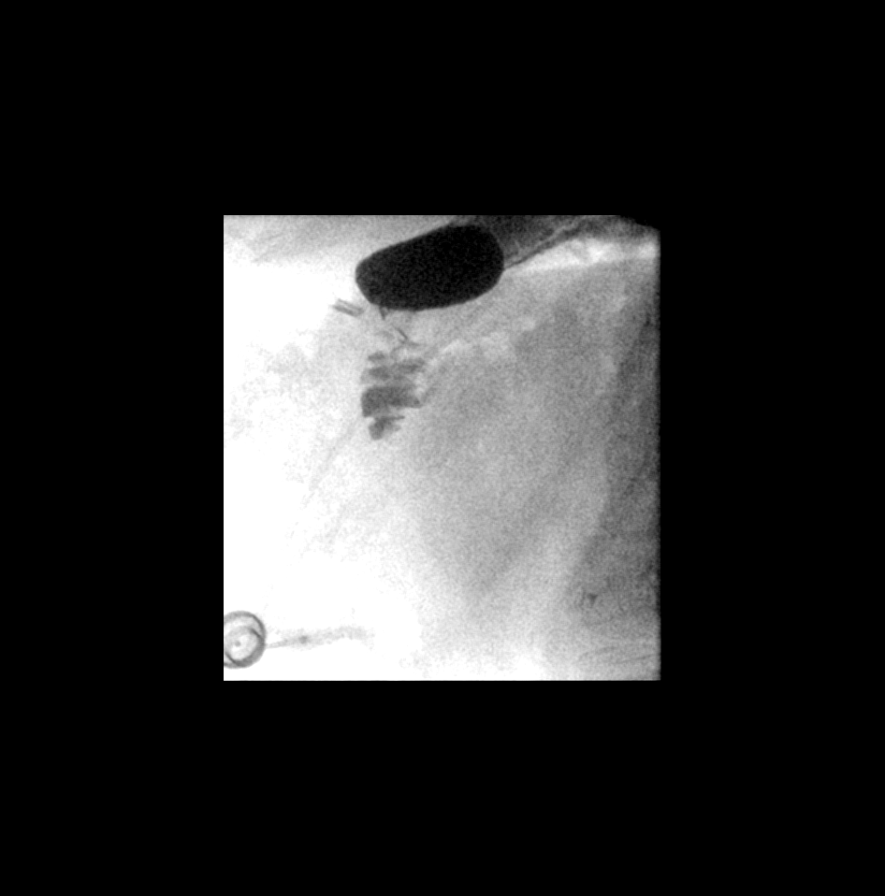

[Series 10: cp_standard · 0.26mm/px · 1 of 1 slices shown (6 of 8)]
[im 1/1]
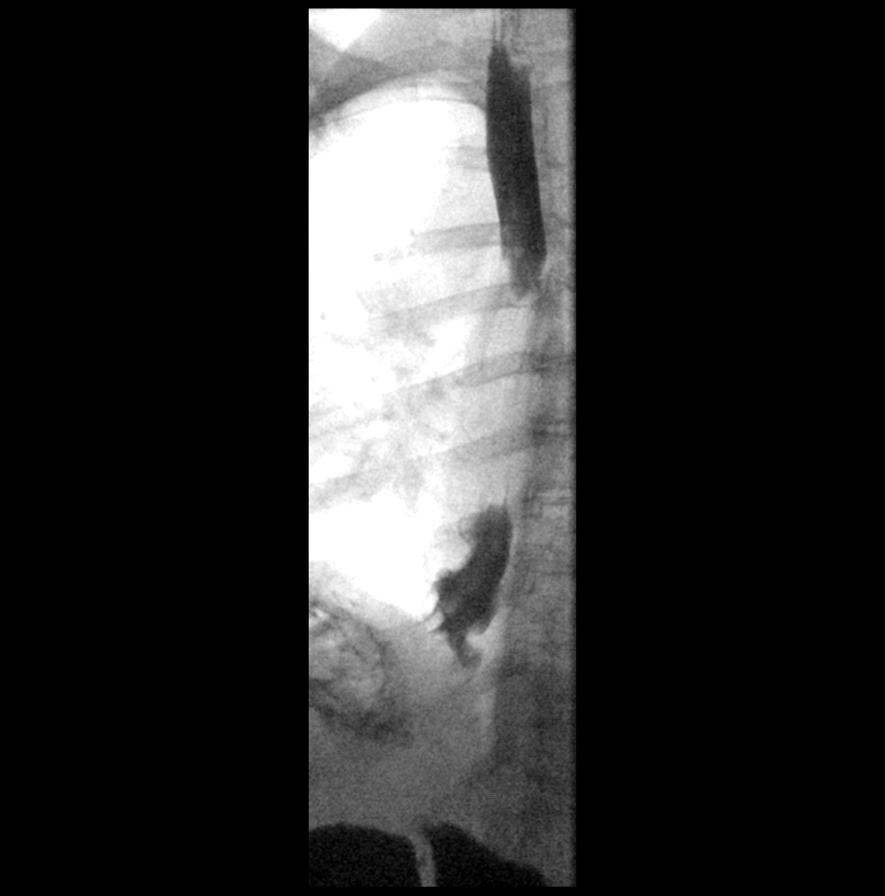

[Series 13: cp_standard · 0.26mm/px · 1 of 1 slices shown (7 of 8)]
[im 1/1]
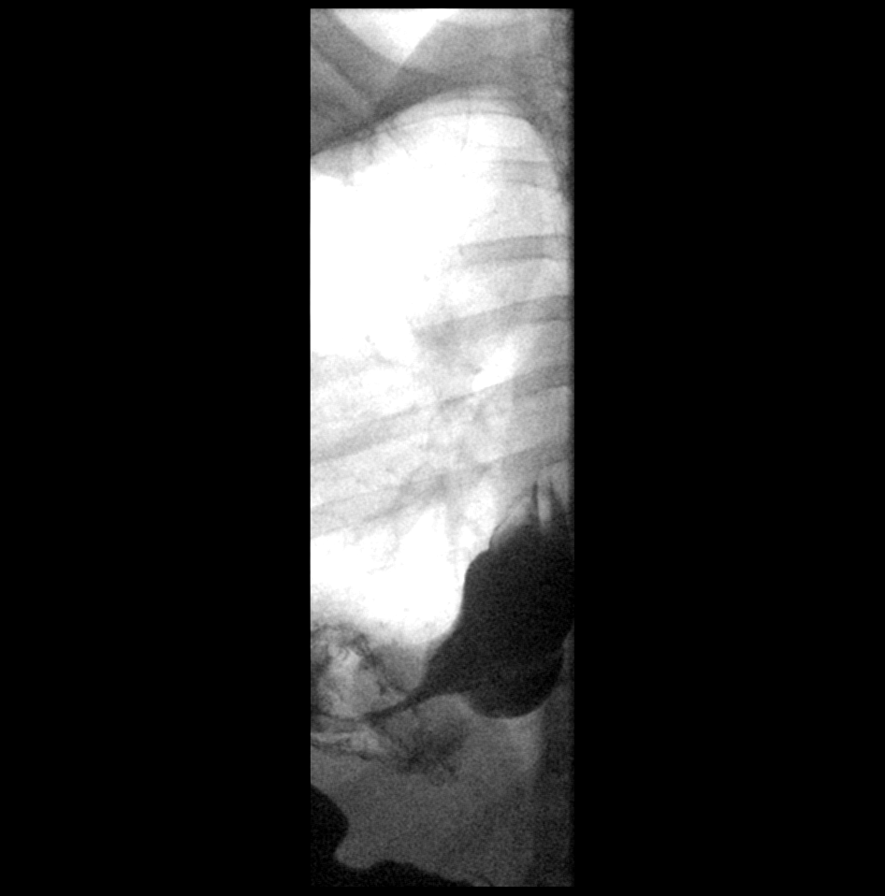

[Series 15: cp_standard · 0.27mm/px · 1 of 1 slices shown (8 of 8)]
[im 1/1]
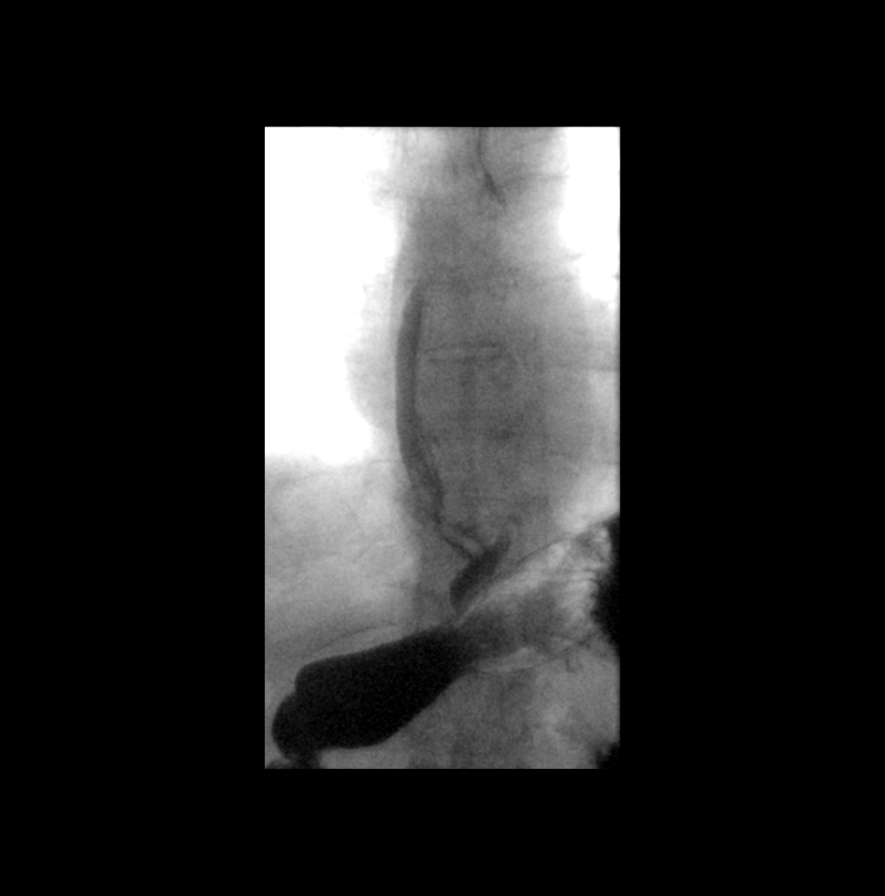

[12 of 24 positions shown; findings below may reference images not displayed]

FINDINGS: Scout radiograph of the abdomen demonstrates gastric banding. The
phi angle measures 24 degrees which is normal. Bowel gas pattern is
unremarkable. Cholecystectomy clips.

Patient swallowed barium without difficulty. There is no esophageal
mass or stricture. Normal esophageal motility. Immediate filling of
the proximal gastric pouch with transient holdup of contrast through
the gastric band. The pouch measures less than 4 cm. Stoma measures
less than 4 mm. There is mild spontaneous gastroesophageal reflux.

Duodenum is unremarkable.
IMPRESSION: Post gastric banding with normal positioning. Size of pouch and
stoma are within normal limits. There is only transient holdup of
contrast through the gastric band.

Mild spontaneous gastroesophageal reflux.

## 2021-12-30 ENCOUNTER — Encounter (HOSPITAL_COMMUNITY): Payer: Self-pay | Admitting: *Deleted

## 2023-01-11 ENCOUNTER — Encounter (HOSPITAL_COMMUNITY): Payer: Self-pay | Admitting: *Deleted

## 2023-08-07 ENCOUNTER — Ambulatory Visit: Admitting: Podiatry

## 2023-08-07 ENCOUNTER — Encounter: Payer: Self-pay | Admitting: Podiatry

## 2023-08-07 DIAGNOSIS — M79675 Pain in left toe(s): Secondary | ICD-10-CM

## 2023-08-07 DIAGNOSIS — B351 Tinea unguium: Secondary | ICD-10-CM

## 2023-08-07 DIAGNOSIS — M79674 Pain in right toe(s): Secondary | ICD-10-CM | POA: Diagnosis not present

## 2023-08-07 NOTE — Progress Notes (Signed)
  Subjective:  Patient ID: Heidi Olson, female    DOB: 08-Aug-1970,  MRN: [id number]  Chief Complaint  Patient presents with   Nail Problem    Bilateral 1st hallux. Fungal. Ongoing for five years or so. Has tried multiple topical treatments with little to no resolution.     53 y.o. female presents with the above complaint. History confirmed with patient. Patient presenting with pain related to dystrophic thickened elongated nails.  She is concerned about fungal appearance of the nails.  Objective:  Physical Exam: warm, good capillary refill nail exam onychomycosis of the toenails, onycholysis, dystrophic nails, and chalky white subungual debris, bilateral first toes most affected DP pulses palpable, PT pulses palpable, and protective sensation intact Left Foot:  Pain with palpation of nails due to elongation and dystrophic growth.  Right Foot: Pain with palpation of nails due to elongation and dystrophic growth.   Assessment:   1. Pain due to onychomycosis of toenails of both feet      Plan:  Patient was evaluated and treated and all questions answered. - Nail specimen obtained for fungal pathology.  She is interested in oral medication.   #Onychomycosis with pain  -Nails palliatively debrided as below. -Educated on self-care  Procedure: Nail Debridement Rationale: Pain Type of Debridement: manual, sharp debridement. Instrumentation: Nail nipper, rotary burr. Number of Nails: 10  Return in about 5 weeks (around 09/11/2023).         Eve Hinders, DPM [facility] / [facility]

## 2023-09-11 ENCOUNTER — Encounter: Payer: Self-pay | Admitting: Podiatry

## 2023-09-11 ENCOUNTER — Ambulatory Visit: Admitting: Podiatry

## 2023-09-11 DIAGNOSIS — L603 Nail dystrophy: Secondary | ICD-10-CM

## 2023-09-11 NOTE — Progress Notes (Signed)
  Subjective:  Patient ID: Heidi Olson, female    DOB: [birth year],  MRN: [id number]  Chief Complaint  Patient presents with   Results    Nail pathology result today. Not diabetic and no anti coag.    53 y.o. female presents with the above complaint.  Here to review nail pathology.  Nail plates overall unchanged from previous  Objective:  Physical Exam: warm, good capillary refill nail exam dystrophy noted with chalky white subungual debris, discoloration present and brittle texture.  Bilateral first toes most affected. DP pulses palpable, PT pulses palpable, and protective sensation intact Left Foot:  Pain with palpation of nails due to elongation and dystrophic growth.  Right Foot: Pain with palpation of nails due to elongation and dystrophic growth.   Assessment:   1. Nail dystrophy      Plan:  Patient was evaluated and treated and all questions answered.  # Nail dystrophy - Nail pathology reviewed with patient, negative for onychomycosis, suggestive of nonspecific nailbed keratinization - Did discuss nutrition with biotin supplementation which may improve appearance of the nails.  Did discuss various topical products which could be helpful in improving nail texture - Recommend patient combine tea tree oil with coconut oil to hydrate nails and have this may improve appearance over time.  Return if symptoms worsen or fail to improve.         Eve Hinders, DPM [facility] / [facility]

## 2023-09-11 NOTE — Patient Instructions (Signed)
 You can combine 3 tablespoons of coconut oil and 10-15 drops of tea tree oil together and apply to the toenails once a day.

## 2024-01-02 ENCOUNTER — Encounter (HOSPITAL_COMMUNITY): Payer: Self-pay | Admitting: *Deleted
